# Patient Record
Sex: Female | Born: 1960 | Race: Black or African American | Hispanic: No | Smoking: Current some day smoker
Health system: Southern US, Community
[De-identification: ages and names within clinical notes are randomized; demographics above are authoritative.]

## PROBLEM LIST (undated history)

## (undated) DIAGNOSIS — F419 Anxiety disorder, unspecified: Secondary | ICD-10-CM

## (undated) DIAGNOSIS — G8929 Other chronic pain: Secondary | ICD-10-CM

## (undated) DIAGNOSIS — M549 Dorsalgia, unspecified: Secondary | ICD-10-CM

## (undated) DIAGNOSIS — I10 Essential (primary) hypertension: Secondary | ICD-10-CM

## (undated) DIAGNOSIS — F32A Depression, unspecified: Secondary | ICD-10-CM

## (undated) DIAGNOSIS — M1990 Unspecified osteoarthritis, unspecified site: Secondary | ICD-10-CM

## (undated) DIAGNOSIS — F329 Major depressive disorder, single episode, unspecified: Secondary | ICD-10-CM

## (undated) HISTORY — DX: Major depressive disorder, single episode, unspecified: F32.9

## (undated) HISTORY — DX: Essential (primary) hypertension: I10

## (undated) HISTORY — PX: OTHER SURGICAL HISTORY: SHX169

## (undated) HISTORY — DX: Depression, unspecified: F32.A

## (undated) HISTORY — DX: Unspecified osteoarthritis, unspecified site: M19.90

## (undated) HISTORY — PX: NOSE SURGERY: SHX723

## (undated) HISTORY — PX: LEG SURGERY: SHX1003

## (undated) HISTORY — DX: Anxiety disorder, unspecified: F41.9

---

## 2007-02-14 ENCOUNTER — Emergency Department: Payer: Self-pay | Admitting: Internal Medicine

## 2008-07-01 ENCOUNTER — Emergency Department: Payer: Self-pay | Admitting: Emergency Medicine

## 2009-09-30 ENCOUNTER — Emergency Department: Payer: Self-pay | Admitting: Internal Medicine

## 2010-07-20 ENCOUNTER — Emergency Department: Payer: Self-pay | Admitting: Emergency Medicine

## 2011-07-10 ENCOUNTER — Emergency Department: Payer: Self-pay | Admitting: Emergency Medicine

## 2011-07-14 ENCOUNTER — Emergency Department: Payer: Self-pay | Admitting: *Deleted

## 2012-08-28 ENCOUNTER — Emergency Department: Payer: Self-pay | Admitting: Emergency Medicine

## 2012-08-28 LAB — COMPREHENSIVE METABOLIC PANEL
BUN: 7 mg/dL (ref 7–18)
Bilirubin,Total: 0.3 mg/dL (ref 0.2–1.0)
Chloride: 109 mmol/L — ABNORMAL HIGH (ref 98–107)
Co2: 28 mmol/L (ref 21–32)
Creatinine: 0.76 mg/dL (ref 0.60–1.30)
EGFR (African American): >60
EGFR (Non-African Amer.): >60
Potassium: 3.6 mmol/L (ref 3.5–5.1)
SGOT(AST): 13 U/L — ABNORMAL LOW (ref 15–37)
SGPT (ALT): 18 U/L (ref 12–78)
Sodium: 144 mmol/L (ref 136–145)

## 2012-08-28 LAB — ACETAMINOPHEN LEVEL: Acetaminophen: <2 ug/mL

## 2012-10-22 ENCOUNTER — Ambulatory Visit: Payer: Self-pay

## 2012-11-14 ENCOUNTER — Emergency Department: Payer: Self-pay | Admitting: Emergency Medicine

## 2013-09-28 ENCOUNTER — Ambulatory Visit: Payer: Self-pay

## 2013-10-05 ENCOUNTER — Ambulatory Visit: Payer: Self-pay

## 2013-10-15 ENCOUNTER — Ambulatory Visit: Payer: Self-pay

## 2013-10-19 ENCOUNTER — Ambulatory Visit: Payer: Self-pay

## 2013-10-19 LAB — URINALYSIS, COMPLETE
Bilirubin,UR: NEGATIVE
Glucose,UR: NEGATIVE mg/dL (ref 0–75)
Ketone: NEGATIVE
Nitrite: NEGATIVE
Ph: 5 (ref 4.5–8.0)
Protein: 30
RBC,UR: 90 /HPF (ref 0–5)
Specific Gravity: 1.011 (ref 1.003–1.030)

## 2013-10-21 LAB — URINE CULTURE

## 2013-11-04 ENCOUNTER — Ambulatory Visit: Payer: Self-pay

## 2013-11-04 LAB — OCCULT BLOOD X 1 CARD TO LAB, STOOL: Occult Blood, Feces: NEGATIVE

## 2013-11-11 ENCOUNTER — Emergency Department: Payer: Self-pay | Admitting: Emergency Medicine

## 2013-11-11 LAB — BASIC METABOLIC PANEL
BUN: 12 mg/dL (ref 7–18)
Calcium, Total: 8.6 mg/dL (ref 8.5–10.1)
Chloride: 106 mmol/L (ref 98–107)
Co2: 28 mmol/L (ref 21–32)
Creatinine: 1.14 mg/dL (ref 0.60–1.30)
EGFR (Non-African Amer.): 56 — ABNORMAL LOW
Glucose: 97 mg/dL (ref 65–99)
Osmolality: 277 (ref 275–301)
Sodium: 139 mmol/L (ref 136–145)

## 2013-11-11 LAB — CBC
HCT: 40.5 % (ref 35.0–47.0)
MCHC: 33.9 g/dL (ref 32.0–36.0)
Platelet: 238 10*3/uL (ref 150–440)
RBC: 4.6 10*6/uL (ref 3.80–5.20)
WBC: 10.9 10*3/uL (ref 3.6–11.0)

## 2013-11-11 LAB — PRO B NATRIURETIC PEPTIDE: B-Type Natriuretic Peptide: 28 pg/mL (ref 0–125)

## 2013-11-14 ENCOUNTER — Ambulatory Visit: Payer: Self-pay

## 2014-01-25 ENCOUNTER — Emergency Department: Payer: Self-pay

## 2014-01-25 LAB — HEPATIC FUNCTION PANEL A (ARMC)
ALK PHOS: 77 U/L
ALT: 17 U/L (ref 12–78)
Albumin: 3.2 g/dL — ABNORMAL LOW (ref 3.4–5.0)
Bilirubin, Direct: <0.1 mg/dL (ref 0.00–0.20)
Bilirubin,Total: 0.3 mg/dL (ref 0.2–1.0)
SGOT(AST): 18 U/L (ref 15–37)
Total Protein: 7.7 g/dL (ref 6.4–8.2)

## 2014-01-25 LAB — CBC
HCT: 40.7 % (ref 35.0–47.0)
HGB: 14.1 g/dL (ref 12.0–16.0)
MCH: 30.8 pg (ref 26.0–34.0)
MCHC: 34.7 g/dL (ref 32.0–36.0)
MCV: 89 fL (ref 80–100)
Platelet: 237 10*3/uL (ref 150–440)
RBC: 4.59 10*6/uL (ref 3.80–5.20)
RDW: 12.9 % (ref 11.5–14.5)
WBC: 10.5 10*3/uL (ref 3.6–11.0)

## 2014-01-25 LAB — BASIC METABOLIC PANEL
Anion Gap: 4 — ABNORMAL LOW (ref 7–16)
BUN: 5 mg/dL — ABNORMAL LOW (ref 7–18)
CHLORIDE: 109 mmol/L — AB (ref 98–107)
Calcium, Total: 8.6 mg/dL (ref 8.5–10.1)
Co2: 27 mmol/L (ref 21–32)
Creatinine: 0.77 mg/dL (ref 0.60–1.30)
Glucose: 100 mg/dL — ABNORMAL HIGH (ref 65–99)
OSMOLALITY: 277 (ref 275–301)
POTASSIUM: 3.2 mmol/L — AB (ref 3.5–5.1)
Sodium: 140 mmol/L (ref 136–145)

## 2014-01-25 LAB — URINALYSIS, COMPLETE
BILIRUBIN, UR: NEGATIVE
Glucose,UR: NEGATIVE mg/dL (ref 0–75)
Ketone: NEGATIVE
Nitrite: NEGATIVE
Ph: 5 (ref 4.5–8.0)
Protein: NEGATIVE
Specific Gravity: 1.013 (ref 1.003–1.030)

## 2014-01-25 LAB — TROPONIN I: Troponin-I: <0.02 ng/mL

## 2014-11-02 DIAGNOSIS — K625 Hemorrhage of anus and rectum: Secondary | ICD-10-CM | POA: Insufficient documentation

## 2014-11-02 DIAGNOSIS — K921 Melena: Secondary | ICD-10-CM | POA: Insufficient documentation

## 2014-11-02 DIAGNOSIS — K30 Functional dyspepsia: Secondary | ICD-10-CM | POA: Insufficient documentation

## 2014-11-20 ENCOUNTER — Ambulatory Visit: Payer: Self-pay | Admitting: Gastroenterology

## 2014-11-29 ENCOUNTER — Emergency Department: Payer: Self-pay | Admitting: Student

## 2014-11-29 LAB — URINALYSIS, COMPLETE
BACTERIA: NONE SEEN
BILIRUBIN, UR: NEGATIVE
Glucose,UR: NEGATIVE mg/dL (ref 0–75)
Ketone: NEGATIVE
NITRITE: NEGATIVE
Ph: 5 (ref 4.5–8.0)
Protein: NEGATIVE
RBC,UR: 8 /HPF (ref 0–5)
Specific Gravity: 1.011 (ref 1.003–1.030)
Squamous Epithelial: 6
WBC UR: 4 /HPF (ref 0–5)

## 2014-11-29 LAB — COMPREHENSIVE METABOLIC PANEL
ALK PHOS: 78 U/L
ALT: 25 U/L
AST: 19 U/L (ref 15–37)
Albumin: 3.5 g/dL (ref 3.4–5.0)
Anion Gap: 5 — ABNORMAL LOW (ref 7–16)
BILIRUBIN TOTAL: 0.4 mg/dL (ref 0.2–1.0)
BUN: 11 mg/dL (ref 7–18)
CALCIUM: 9 mg/dL (ref 8.5–10.1)
CHLORIDE: 106 mmol/L (ref 98–107)
CO2: 28 mmol/L (ref 21–32)
Creatinine: 1.02 mg/dL (ref 0.60–1.30)
EGFR (Non-African Amer.): >60
GLUCOSE: 98 mg/dL (ref 65–99)
Osmolality: 277 (ref 275–301)
Potassium: 3.7 mmol/L (ref 3.5–5.1)
SODIUM: 139 mmol/L (ref 136–145)
TOTAL PROTEIN: 8.4 g/dL — AB (ref 6.4–8.2)

## 2014-11-29 LAB — CBC
HCT: 43.5 % (ref 35.0–47.0)
HGB: 14.4 g/dL (ref 12.0–16.0)
MCH: 29.9 pg (ref 26.0–34.0)
MCHC: 33.2 g/dL (ref 32.0–36.0)
MCV: 90 fL (ref 80–100)
Platelet: 262 10*3/uL (ref 150–440)
RBC: 4.83 10*6/uL (ref 3.80–5.20)
RDW: 13.8 % (ref 11.5–14.5)
WBC: 12.6 10*3/uL — ABNORMAL HIGH (ref 3.6–11.0)

## 2014-11-29 LAB — LIPASE, BLOOD: LIPASE: 129 U/L (ref 73–393)

## 2014-12-01 ENCOUNTER — Emergency Department: Payer: Self-pay | Admitting: Emergency Medicine

## 2014-12-05 DIAGNOSIS — R21 Rash and other nonspecific skin eruption: Secondary | ICD-10-CM | POA: Insufficient documentation

## 2015-01-26 ENCOUNTER — Ambulatory Visit: Payer: Self-pay | Admitting: Nurse Practitioner

## 2015-01-30 ENCOUNTER — Emergency Department: Payer: Self-pay | Admitting: Emergency Medicine

## 2015-02-15 ENCOUNTER — Ambulatory Visit: Payer: Self-pay | Admitting: Internal Medicine

## 2015-02-16 DIAGNOSIS — S83289A Other tear of lateral meniscus, current injury, unspecified knee, initial encounter: Secondary | ICD-10-CM | POA: Insufficient documentation

## 2015-02-16 DIAGNOSIS — M179 Osteoarthritis of knee, unspecified: Secondary | ICD-10-CM | POA: Insufficient documentation

## 2015-02-16 DIAGNOSIS — M1710 Unilateral primary osteoarthritis, unspecified knee: Secondary | ICD-10-CM | POA: Insufficient documentation

## 2015-03-07 ENCOUNTER — Emergency Department: Payer: Self-pay | Admitting: Emergency Medicine

## 2015-04-11 ENCOUNTER — Other Ambulatory Visit
Admit: 2015-04-11 | Disposition: A | Discharge status: Home / Self Care | Payer: Self-pay | Attending: Psychiatry | Admitting: Psychiatry

## 2015-04-11 LAB — LIPID PANEL
Cholesterol: 229 mg/dL — ABNORMAL HIGH
HDL Cholesterol: 35 mg/dL — ABNORMAL LOW
LDL CHOLESTEROL, CALC: 168 mg/dL — AB
TRIGLYCERIDES: 129 mg/dL
VLDL Cholesterol, Calc: 26 mg/dL

## 2015-04-11 LAB — GLUCOSE, RANDOM: Glucose: 108 mg/dL — ABNORMAL HIGH

## 2015-05-07 DIAGNOSIS — M797 Fibromyalgia: Secondary | ICD-10-CM | POA: Insufficient documentation

## 2015-05-07 DIAGNOSIS — M47817 Spondylosis without myelopathy or radiculopathy, lumbosacral region: Secondary | ICD-10-CM | POA: Insufficient documentation

## 2015-05-07 DIAGNOSIS — M4716 Other spondylosis with myelopathy, lumbar region: Secondary | ICD-10-CM | POA: Insufficient documentation

## 2015-05-24 ENCOUNTER — Ambulatory Visit (INDEPENDENT_AMBULATORY_CARE_PROVIDER_SITE_OTHER): Payer: Medicaid Other | Admitting: Psychiatry

## 2015-05-24 ENCOUNTER — Other Ambulatory Visit: Payer: Self-pay

## 2015-05-24 ENCOUNTER — Encounter: Payer: Self-pay | Admitting: Psychiatry

## 2015-05-24 VITALS — BP 138/88 | HR 83 | Temp 98.5°F | Ht 70.0 in

## 2015-05-24 DIAGNOSIS — F250 Schizoaffective disorder, bipolar type: Secondary | ICD-10-CM

## 2015-05-24 DIAGNOSIS — I10 Essential (primary) hypertension: Secondary | ICD-10-CM | POA: Insufficient documentation

## 2015-05-24 MED ORDER — MIRTAZAPINE 15 MG PO TABS: 15.0000 mg | ORAL_TABLET | Freq: Every day | ORAL | Status: DC

## 2015-05-24 MED ORDER — FLUOXETINE HCL 20 MG PO CAPS: 20.0000 mg | ORAL_CAPSULE | Freq: Every morning | ORAL | Status: DC

## 2015-05-24 MED ORDER — ZIPRASIDONE HCL 60 MG PO CAPS: 60.0000 mg | ORAL_CAPSULE | Freq: Two times a day (BID) | ORAL | Status: DC

## 2015-05-24 MED ORDER — CLONAZEPAM 1 MG PO TABS: 1.0000 mg | ORAL_TABLET | Freq: Two times a day (BID) | ORAL | Status: DC | PRN

## 2015-05-24 NOTE — Progress Notes (Signed)
BH MD/PA/NP OP Progress Note  05/24/2015 12:13 PM Sonia Collins  MRN:  [id number]  Subjective:  Patient presents for follow-up of her schizoaffective disorder, PTSD and anxiety. She states she feels good about current medications that she feels like her mood is not down and depressed. She states that some of her social stressors such as her kids behavior (i.e. drug use) does not bother her as much on these medications. She's also states she's done some limit setting with telling them not to come around and bring her down. She states that her appetite is been fair. We discussed the reason she stated that she used to eat her mother's cooking and because she cooks like her mother the food reminds her of her mother and it's difficult for her to eat. She states she is able to enjoy things and spends time outside with her flowers. She states is a neighbor that brings her pieces of cloth which patient sews together to make blankets. Patient states she cares for her grandchildren 3 out of 5 days of the week and enjoys this.  Stated that her sleep is okay but states that over the past few weeks she has had some difficulty sleeping because she hears her mother's voice at night. She attributes this to getting close to the anniversary of her mother's death which occurred last year in July.  As overall she feels her mood is been good and stable on the current medication regimen. She states that she has not heard the voice of [name] since we have increased her Geodon. Chief Complaint:  Chief Complaint    Other     Visit Diagnosis:     ICD-9-CM ICD-10-CM   1. Schizoaffective disorder, bipolar type 295.70 F25.0 clonazePAM (KLONOPIN) 1 MG tablet     FLUoxetine (PROZAC) 20 MG capsule     ziprasidone (GEODON) 60 MG capsule     mirtazapine (REMERON) 15 MG tablet    Past Medical History:  Past Medical History  Diagnosis Date  . Anxiety   . Depression   . Hypertension   . Arthritis     Past Surgical History   Procedure Laterality Date  . Leg surgery Bilateral    Family History:  Family History  Problem Relation Age of Onset  . Hypertension Mother   . Alcohol abuse Father   . Alcohol abuse Sister   . Alcohol abuse Brother   . Alcohol abuse Maternal Uncle   . Alcohol abuse Paternal Uncle   . Bipolar disorder Cousin   . Schizophrenia Daughter   . Depression Daughter    Social History:  History   Social History  . Marital Status: Widowed    Spouse Name: N/A  . Number of Children: N/A  . Years of Education: N/A   Social History Main Topics  . Smoking status: Current Every Day Smoker -- 0.50 packs/day for 30 years    Types: Cigarettes  . Smokeless tobacco: Never Used  . Alcohol Use: No  . Drug Use: Yes     Comment: 3 mth used marjuana  . Sexual Activity: No   Other Topics Concern  . None   Social History Narrative  . None   Additional History:   Assessment:   Musculoskeletal: Strength & Muscle Tone: within normal limits Gait & Station: normal Patient leans: N/A  Psychiatric Specialty Exam: HPI  Review of Systems  Psychiatric/Behavioral: Negative for depression, suicidal ideas, hallucinations, memory loss and substance abuse. The patient has insomnia (Patient indicated that over  the past few weeks she's had difficulty sleeping. She states she might wake up at night because she hears her mother's voice. She states she is approaching the anniversary of his mother's death which occurred in 2023-07-04 of last yea). The patient is not nervous/anxious.     Blood pressure 138/88, pulse 83, temperature 98.5 F (36.9 C), temperature source Tympanic, height  (1.778 m), SpO2 94 %.There is no weight on file to calculate BMI.  General Appearance: Well Groomed  Eye Contact:  Good  Speech:  Normal Rate  Volume:  Normal  Mood:  Good  Affect:  Congruent  Thought Process:  Goal Directed and Linear  Orientation:  Full (Time, Place, and Person)  Thought Content:  Negative  Suicidal  Thoughts:  No  Homicidal Thoughts:  No  Memory:  Immediate;   Good Recent;   Good Remote;   Good  Judgement:  Fair  Insight:  Fair  Psychomotor Activity:  Negative  Concentration:  Good  Recall:  Good  Fund of Knowledge: Good  Language: Good  Akathisia:  Negative  Handed:  Right unknown  AIMS (if indicated):  Done in March 2016 in [employer]  Assets:  Desire for Improvement  ADL's:  Intact  Cognition: WNL  Sleep:  Fair as noted above   Is the patient at risk to self?  No. Has the patient been a risk to self in the past 6 months?  No. Has the patient been a risk to self within the distant past?  Yes.   Is the patient a risk to others?  No. Has the patient been a risk to others in the past 6 months?  No. Has the patient been a risk to others within the distant past?  Yes.    Current Medications: Current Outpatient Prescriptions  Medication Sig Dispense Refill  . clonazePAM (KLONOPIN) 1 MG tablet Take 1 tablet (1 mg total) by mouth 2 (two) times daily as needed for anxiety. 60 tablet 1  . FLUoxetine (PROZAC) 20 MG capsule Take 1 capsule (20 mg total) by mouth every morning. 30 capsule 2  . guaiFENesin-codeine (GUAIFENESIN AC) 100-10 MG/5ML syrup     . simvastatin (ZOCOR) 10 MG tablet   0  . ziprasidone (GEODON) 60 MG capsule Take 1 capsule (60 mg total) by mouth 2 (two) times daily. 60 capsule 2  . chlorhexidine (PERIDEX) 0.12 % solution SWISH WITH 1/2 OUNCE FOR 30 SECONDS TWICE DAILY OR USE AS DIRECTED.  0  . clindamycin (CLEOCIN) 300 MG capsule TAKE 3 TIMES A DAY UNTIL GONE  0  . HYDROcodone-acetaminophen (NORCO/VICODIN) 5-325 MG per tablet TAKE 1 TO 2 TABLETS EVERY 4 TO 6 HOURS AS NEEDED FOR PAIN.  0  . hydrOXYzine (VISTARIL) 50 MG capsule take 1 capsule by mouth twice a day if needed  0  . lisinopril-hydrochlorothiazide (PRINZIDE,ZESTORETIC) 10-12.5 MG per tablet   0  . mirtazapine (REMERON) 15 MG tablet Take 1 tablet (15 mg total) by mouth at bedtime. 30 tablet 2  .  tetracycline (ACHROMYCIN,SUMYCIN) 250 MG capsule TAKE 1 CAPSULE 3 TIMES DAILY.  0  . traMADol (ULTRAM) 50 MG tablet TAKE 1 TO 2 TABLETS EVERY 4 TO SIX HOURS, DO NOT EXCEED 8 TABLETS IN 24 HOURS  0  . ziprasidone (GEODON) 20 MG capsule Take 20 mg by mouth 2 (two) times daily.  0  . ziprasidone (GEODON) 40 MG capsule Take 40 mg by mouth 2 (two) times daily.  0   No current facility-administered medications  for this visit.    Medical Decision Making:  Established Problem, Stable/Improving (1)  Treatment Plan Summary:Medication management Patient is currently stable continue her medications without any changes. She'll continue Geodon 60 mg twice daily, Prozac 20 mg daily and Remeron 15 mg at bedtime. She'll also continue her clonazepam 1 mg twice daily as needed for anxiety. She'll follow up in 2 months. She'll encourage call if questions or concerns prior to her next appointment.  Wallace Going 05/24/2015, 12:13 PM

## 2015-05-30 ENCOUNTER — Ambulatory Visit: Payer: Medicaid Other | Admitting: Licensed Clinical Social Worker

## 2015-06-15 DIAGNOSIS — M5136 Other intervertebral disc degeneration, lumbar region: Secondary | ICD-10-CM | POA: Insufficient documentation

## 2015-06-15 DIAGNOSIS — M5116 Intervertebral disc disorders with radiculopathy, lumbar region: Secondary | ICD-10-CM | POA: Insufficient documentation

## 2015-06-27 ENCOUNTER — Ambulatory Visit (INDEPENDENT_AMBULATORY_CARE_PROVIDER_SITE_OTHER): Payer: Medicaid Other | Admitting: Psychiatry

## 2015-06-27 ENCOUNTER — Encounter: Payer: Self-pay | Admitting: Psychiatry

## 2015-06-27 VITALS — BP 143/97 | HR 83 | Resp 12

## 2015-06-27 DIAGNOSIS — F250 Schizoaffective disorder, bipolar type: Secondary | ICD-10-CM

## 2015-06-27 MED ORDER — CLONAZEPAM 1 MG PO TABS: 1.0000 mg | ORAL_TABLET | Freq: Two times a day (BID) | ORAL | Status: DC | PRN

## 2015-06-27 MED ORDER — ZIPRASIDONE HCL 80 MG PO CAPS: 80.0000 mg | ORAL_CAPSULE | Freq: Two times a day (BID) | ORAL | Status: DC
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
## 2015-07-04 ENCOUNTER — Other Ambulatory Visit: Payer: Self-pay

## 2015-07-04 ENCOUNTER — Ambulatory Visit (INDEPENDENT_AMBULATORY_CARE_PROVIDER_SITE_OTHER): Payer: Medicaid Other | Admitting: Licensed Clinical Social Worker

## 2015-07-04 DIAGNOSIS — F250 Schizoaffective disorder, bipolar type: Secondary | ICD-10-CM
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
## 2015-07-05 DIAGNOSIS — F250 Schizoaffective disorder, bipolar type: Secondary | ICD-10-CM | POA: Insufficient documentation

## 2015-07-13 ENCOUNTER — Other Ambulatory Visit: Payer: Self-pay | Admitting: Nurse Practitioner

## 2015-07-13 DIAGNOSIS — Z1231 Encounter for screening mammogram for malignant neoplasm of breast: Secondary | ICD-10-CM

## 2015-07-18 ENCOUNTER — Ambulatory Visit: Payer: Self-pay | Admitting: Licensed Clinical Social Worker

## 2015-07-25 ENCOUNTER — Ambulatory Visit (INDEPENDENT_AMBULATORY_CARE_PROVIDER_SITE_OTHER): Payer: Medicaid Other | Admitting: Psychiatry

## 2015-07-25 ENCOUNTER — Encounter: Payer: Self-pay | Admitting: Psychiatry

## 2015-07-25 VITALS — BP 122/78 | HR 90 | Temp 98.4°F | Ht 70.0 in | Wt 267.0 lb

## 2015-07-25 DIAGNOSIS — F250 Schizoaffective disorder, bipolar type: Secondary | ICD-10-CM | POA: Diagnosis not present

## 2015-07-25 NOTE — Progress Notes (Signed)
BH MD/PA/NP OP Progress Note  07/25/2015 11:01 AM Sonia Collins  MRN:  [id number]  Subjective:  Patient returns for follow-up of her PTSD and Schizoaffective Disorder. She is able to state that the medications are helping her stay calm. She states she has not heard bob's voice. However she does state she has been moving around her house talking to herself and states she largely does this because of the stress of the home environment. She discusses similar things that she has in the past appointments such as her middle daughter causing her stress. She states they say hateful things to her. She states this is upsetting. She states she feels like these things make her want to abuse drugs. She states she has not returned to drinking but she does state she has used some marijuana and states it's probably about once per week. Spent some time thinking about options that she has finding alternative settings to live in. She states she is limited financially. She indicated she is currently assessed as being partially disabled. I told her that she could call the office and perhaps we could assess her current status in regards to disability. Chief Complaint:  Chief Complaint    Anxiety; Family Problem; Depression; Medication Refill; Follow-up; Panic Attack     Visit Diagnosis:  No diagnosis found.  Past Medical History:  Past Medical History  Diagnosis Date  . Anxiety   . Depression   . Hypertension   . Arthritis     Past Surgical History  Procedure Laterality Date  . Leg surgery Bilateral    Family History:  Family History  Problem Relation Age of Onset  . Hypertension Mother   . Alcohol abuse Father   . Alcohol abuse Sister   . Drug abuse Sister   . Alcohol abuse Brother   . Drug abuse Brother   . Alcohol abuse Maternal Uncle   . Alcohol abuse Paternal Uncle   . Bipolar disorder Cousin   . Schizophrenia Daughter   . Depression Daughter   . Alcohol abuse Sister   . Alcohol abuse Sister    . Alcohol abuse Sister   . Alcohol abuse Sister   . Alcohol abuse Sister   . Alcohol abuse Sister   . Alcohol abuse Brother   . Alcohol abuse Brother   . Alcohol abuse Brother   . Alcohol abuse Brother   . Alcohol abuse Brother   . Alcohol abuse Brother   . Alcohol abuse Brother    Social History:  Social History   Social History  . Marital Status: Widowed    Spouse Name: N/A  . Number of Children: N/A  . Years of Education: N/A   Social History Main Topics  . Smoking status: Current Some Day Smoker -- 0.50 packs/day for 30 years    Types: Cigarettes    Start date: 07/24/2005  . Smokeless tobacco: Never Used  . Alcohol Use: No  . Drug Use: Yes     Comment: 3 mth used marjuana  . Sexual Activity: No   Other Topics Concern  . None   Social History Narrative   Additional History:  Assessment:   Musculoskeletal: Strength & Muscle Tone: within normal limits Gait & Station: normal Patient leans: N/A  Psychiatric Specialty Exam: HPI  Review of Systems  Psychiatric/Behavioral: Negative for depression, suicidal ideas, hallucinations, memory loss and substance abuse. The patient is nervous/anxious. The patient does not have insomnia.     Blood pressure 122/78, pulse 90, temperature 98.4  F (36.9 C), temperature source Tympanic, height 5\' 10"  (1.778 m), weight 267 lb (121.11 kg), SpO2 92 %.Body mass index is 38.31 kg/(m^2).  General Appearance: Fairly Groomed  Eye Contact:  Good  Speech:  Normal Rate  Volume:  Normal  Mood:  Okay  Affect:  Constricted tearful when discussing the relationship with her family members   Thought Process:  Linear and Logical  Orientation:  Full (Time, Place, and Person)  Thought Content:  Negative  Suicidal Thoughts:  No  Homicidal Thoughts:  No  Memory:  Immediate;   Good Recent;   Good Remote;   Good  Judgement:  Fair  Insight:  Fair  Psychomotor Activity:  Negative  Concentration:  Good  Recall:  Good  Fund of Knowledge:  Fair  Language: Fair  Akathisia:  Yes  Handed:  Right unknown   AIMS (if indicated): not DOEN TODAY  Assets:  Desire for Improvement  ADL's:  Intact  Cognition: WNL  Sleep:  GOOD   Is the patient at risk to self?  No. Has the patient been a risk to self in the past 6 months?  No. Has the patient been a risk to self within the distant past?  No. Is the patient a risk to others?  No. Has the patient been a risk to others in the past 6 months?  No. Has the patient been a risk to others within the distant past?  No.  Current Medications: Current Outpatient Prescriptions  Medication Sig Dispense Refill  . chlorhexidine (PERIDEX) 0.12 % solution SWISH WITH 1/2 OUNCE FOR 30 SECONDS TWICE DAILY OR USE AS DIRECTED.  0  . clindamycin (CLEOCIN) 300 MG capsule TAKE 3 TIMES A DAY UNTIL GONE  0  . clonazePAM (KLONOPIN) 1 MG tablet Take 1 tablet (1 mg total) by mouth 2 (two) times daily as needed for anxiety. 60 tablet 1  . FLUoxetine (PROZAC) 20 MG capsule Take 1 capsule (20 mg total) by mouth every morning. 30 capsule 2  . hydrOXYzine (VISTARIL) 50 MG capsule take 1 capsule by mouth twice a day if needed  0  . lisinopril-hydrochlorothiazide (PRINZIDE,ZESTORETIC) 10-12.5 MG per tablet   0  . mirtazapine (REMERON) 15 MG tablet Take 1 tablet (15 mg total) by mouth at bedtime. 30 tablet 2  . naproxen (NAPROSYN) 500 MG tablet Take 500 mg by mouth 2 (two) times daily with a meal.  0  . pravastatin (PRAVACHOL) 40 MG tablet Take 40 mg by mouth every evening. for cholesterol  0  . simvastatin (ZOCOR) 10 MG tablet   0  . tetracycline (ACHROMYCIN,SUMYCIN) 250 MG capsule TAKE 1 CAPSULE 3 TIMES DAILY.  0  . ziprasidone (GEODON) 80 MG capsule Take 1 capsule (80 mg total) by mouth 2 (two) times daily with a meal. 60 capsule 1   No current facility-administered medications for this visit.    Medical Decision Making:  Established Problem, Stable/Improving (1) and Review of Medication Regimen & Side Effects  (2)  Treatment Plan Summary:Medication management and Plan Patient has been stable on her current regimen. She denies hearing auditory hallucinations of [name] which has been a previous issue. She states the medications do he keep her calm. The biggest problem appears to be some issues with her social supports and living situation. We will continue the patient on her medications without any changes. She will continue the Geodon 80 mg twice daily, Remeron 15 mg at bedtime, Prozac 20 mg daily and clonazepam 1 mg twice daily. She  will continue with her therapy. She will follow up in 1 month. She should have enough of her medications to carry her until her next appointment in 1 month.   Wallace Going 07/25/2015, 11:01 AM

## 2015-07-26 ENCOUNTER — Ambulatory Visit
Admission: RE | Admit: 2015-07-26 | Discharge: 2015-07-26 | Disposition: A | Discharge status: Home / Self Care | Payer: Medicaid Other | Source: Ambulatory Visit | Attending: Nurse Practitioner | Admitting: Nurse Practitioner

## 2015-07-26 ENCOUNTER — Ambulatory Visit: Payer: Self-pay

## 2015-07-26 ENCOUNTER — Encounter (INDEPENDENT_AMBULATORY_CARE_PROVIDER_SITE_OTHER): Payer: Self-pay

## 2015-07-26 DIAGNOSIS — Z1231 Encounter for screening mammogram for malignant neoplasm of breast: Secondary | ICD-10-CM | POA: Diagnosis not present

## 2015-07-27 ENCOUNTER — Telehealth: Payer: Self-pay

## 2015-07-27 NOTE — Telephone Encounter (Signed)
pt called stated that you needed the phone # to [employer]  [phone]  pt states you will need her ss# [id number] and her DOB [birth year].

## 2015-07-27 NOTE — Telephone Encounter (Signed)
I contacted the office of Social Security to provide an update as to the patient's mental health. However I contacted the number the patient left they were not able to give me any information as they had an authorization to disclose information to me. I've contacted the patient and informed her of this. The Social Security office informing the patient will need to call and they can direct her as to how to involve me in providing any updates. AW

## 2015-08-02 ENCOUNTER — Ambulatory Visit: Payer: Self-pay | Admitting: Psychiatry

## 2015-08-28 ENCOUNTER — Ambulatory Visit: Payer: Medicaid Other | Admitting: Psychiatry

## 2015-08-29 ENCOUNTER — Encounter: Payer: Self-pay | Admitting: Psychiatry

## 2015-08-29 ENCOUNTER — Ambulatory Visit (INDEPENDENT_AMBULATORY_CARE_PROVIDER_SITE_OTHER): Payer: Medicaid Other | Admitting: Psychiatry

## 2015-08-29 VITALS — BP 124/82 | HR 98 | Temp 98.2°F | Ht 70.0 in | Wt 267.4 lb

## 2015-08-29 DIAGNOSIS — F250 Schizoaffective disorder, bipolar type: Secondary | ICD-10-CM

## 2015-08-29 MED ORDER — ZIPRASIDONE HCL 80 MG PO CAPS: 80.0000 mg | ORAL_CAPSULE | Freq: Two times a day (BID) | ORAL | Status: DC

## 2015-08-29 MED ORDER — FLUOXETINE HCL 20 MG PO CAPS: 20.0000 mg | ORAL_CAPSULE | Freq: Every morning | ORAL | Status: DC

## 2015-08-29 MED ORDER — TRAZODONE HCL 50 MG PO TABS: ORAL_TABLET | ORAL | Status: DC

## 2015-08-29 MED ORDER — MIRTAZAPINE 15 MG PO TABS: 15.0000 mg | ORAL_TABLET | Freq: Every day | ORAL | Status: DC

## 2015-08-29 MED ORDER — CLONAZEPAM 1 MG PO TABS: 1.0000 mg | ORAL_TABLET | Freq: Two times a day (BID) | ORAL | Status: DC | PRN

## 2015-08-29 NOTE — Progress Notes (Signed)
BH MD/PA/NP OP Progress Note  08/29/2015 3:34 PM Sonia Collins  MRN:  [id number]  Subjective:  Patient returns for follow-up of her PTSD and Schizoaffective Disorder. This appointment she speaks positively about her interactions with one of her daughters. She discusses avoiding another daughter who she reports is hateful things and causes her stress. She states that she does look forward to on a breakfast with the one daughter and her granddaughter. She states they do this several times a week. She stated that her medications generally of been helping her mood is generally pretty good. She states right now the biggest issue is sleep. She discussed that she has sleep onset experiences such as seeing her mother. She states she occasionally has dreams that we'll involve her mother but they are not nightmares. That the medication she takes might help her sleep for a few hours but then she is up and then she is having these feelings and thoughts and at times sometimes smells that reminded her of other people. Chief Complaint: Dreaming Chief Complaint    Follow-up; Medication Refill; Other; Memory Loss; Medication Problem     Visit Diagnosis:     ICD-9-CM ICD-10-CM   1. Schizoaffective disorder, bipolar type 295.70 F25.0 clonazePAM (KLONOPIN) 1 MG tablet     FLUoxetine (PROZAC) 20 MG capsule     ziprasidone (GEODON) 80 MG capsule     mirtazapine (REMERON) 15 MG tablet    Past Medical History:  Past Medical History  Diagnosis Date  . Anxiety   . Depression   . Hypertension   . Arthritis     Past Surgical History  Procedure Laterality Date  . Leg surgery Bilateral    Family History:  Family History  Problem Relation Age of Onset  . Hypertension Mother   . Alcohol abuse Father   . Alcohol abuse Sister   . Drug abuse Sister   . Alcohol abuse Brother   . Drug abuse Brother   . Alcohol abuse Maternal Uncle   . Alcohol abuse Paternal Uncle   . Bipolar disorder Cousin   . Schizophrenia  Daughter   . Depression Daughter   . Alcohol abuse Sister   . Alcohol abuse Sister   . Alcohol abuse Sister   . Alcohol abuse Sister   . Alcohol abuse Sister   . Alcohol abuse Sister   . Alcohol abuse Brother   . Alcohol abuse Brother   . Alcohol abuse Brother   . Alcohol abuse Brother   . Alcohol abuse Brother   . Alcohol abuse Brother   . Alcohol abuse Brother    Social History:  Social History   Social History  . Marital Status: Widowed    Spouse Name: N/A  . Number of Children: N/A  . Years of Education: N/A   Social History Main Topics  . Smoking status: Current Some Day Smoker -- 0.50 packs/day for 30 years    Types: Cigarettes    Start date: 07/24/2005  . Smokeless tobacco: Never Used  . Alcohol Use: No  . Drug Use: Yes     Comment: 3 mth used marjuana  . Sexual Activity: No   Other Topics Concern  . None   Social History Narrative   Additional History:  Assessment:   Musculoskeletal: Strength & Muscle Tone: within normal limits Gait & Station: normal Patient leans: N/A  Psychiatric Specialty Exam: HPI  Review of Systems  Psychiatric/Behavioral: Negative for depression, suicidal ideas, hallucinations, memory loss and substance abuse. The patient  has insomnia. The patient is not nervous/anxious.     Blood pressure 124/82, pulse 98, temperature 98.2 F (36.8 C), temperature source Tympanic, height 5\' 10"  (1.778 m), weight 267 lb 6.4 oz (121.292 kg), SpO2 94 %.Body mass index is 38.37 kg/(m^2).  General Appearance: Fairly Groomed  Eye Contact:  Good  Speech:  Normal Rate  Volume:  Normal  Mood:  Good  Affect:  Patient was brighter than in past appointments and was times able to smile he became tearful when discussing not wanting to go back to her brothers home for a reunion as it would remind her of her late mother, but otherwise was bright during the appointment   Thought Process:  Linear and Logical  Orientation:  Full (Time, Place, and Person)   Thought Content:  Negative  Suicidal Thoughts:  No  Homicidal Thoughts:  No  Memory:  Immediate;   Good Recent;   Good Remote;   Good  Judgement:  Fair  Insight:  Fair  Psychomotor Activity:  Negative  Concentration:  Good  Recall:  Good  Fund of Knowledge: Fair  Language: Fair  Akathisia:  Yes  Handed:  Right unknown   AIMS (if indicated): not DOEN TODAY  Assets:  Desire for Improvement  ADL's:  Intact  Cognition: WNL  Sleep:  GOOD   Is the patient at risk to self?  No. Has the patient been a risk to self in the past 6 months?  No. Has the patient been a risk to self within the distant past?  No. Is the patient a risk to others?  No. Has the patient been a risk to others in the past 6 months?  No. Has the patient been a risk to others within the distant past?  No.  Current Medications: Current Outpatient Prescriptions  Medication Sig Dispense Refill  . ADVAIR DISKUS 250-50 MCG/DOSE AEPB inhale 1 dose by mouth twice a day FOR ASTHMA/COPD CONTROL  0  . chlorhexidine (PERIDEX) 0.12 % solution SWISH WITH 1/2 OUNCE FOR 30 SECONDS TWICE DAILY OR USE AS DIRECTED.  0  . clindamycin (CLEOCIN) 300 MG capsule TAKE 3 TIMES A DAY UNTIL GONE  0  . clonazePAM (KLONOPIN) 1 MG tablet Take 1 tablet (1 mg total) by mouth 2 (two) times daily as needed for anxiety. 60 tablet 2  . FLUoxetine (PROZAC) 20 MG capsule Take 1 capsule (20 mg total) by mouth every morning. 30 capsule 2  . lisinopril-hydrochlorothiazide (PRINZIDE,ZESTORETIC) 10-12.5 MG per tablet   0  . lisinopril-hydrochlorothiazide (PRINZIDE,ZESTORETIC) 20-25 MG per tablet Take 1 tablet by mouth daily. for high blood pressure  0  . mirtazapine (REMERON) 15 MG tablet Take 1 tablet (15 mg total) by mouth at bedtime. 30 tablet 2  . naproxen (NAPROSYN) 500 MG tablet Take 500 mg by mouth 2 (two) times daily with a meal.  0  . pravastatin (PRAVACHOL) 40 MG tablet Take 40 mg by mouth every evening. for cholesterol  0  . simvastatin (ZOCOR)  10 MG tablet   0  . tetracycline (ACHROMYCIN,SUMYCIN) 250 MG capsule TAKE 1 CAPSULE 3 TIMES DAILY.  0  . ziprasidone (GEODON) 80 MG capsule Take 1 capsule (80 mg total) by mouth 2 (two) times daily with a meal. 60 capsule 2  . traZODone (DESYREL) 50 MG tablet Take one to two tablets at bedtime as needed for sleep. 60 tablet 1   No current facility-administered medications for this visit.    Medical Decision Making:  Established Problem, Stable/Improving (  1) and Review of Medication Regimen & Side Effects (2)  Treatment Plan Summary:Medication management and Plan Patient has been stable on her current regimen. She denies hearing auditory hallucinations of [name] which has been a previous issue. She states the medications do he keep her calm. The biggest problem appears to be some issues with her social supports and living situation. We will continue the patient on her medications without any changes. She will continue the Geodon 80 mg twice daily, Remeron 15 mg at bedtime, Prozac 20 mg daily and clonazepam 1 mg twice daily. We will add trazodone 50-100 mg at bedtime as needed for insomnia area risk and benefits of been discussing patient's able to consent She will continue with her therapy. She will follow up in 1 month. She should have enough of her medications to carry her until her next appointment in 2 months.  PTSD-continue Remeron and Prozac and lorazepam Schizoaffective Disorder  disorder-continue Geodon 80 mg twice daily.  Wallace Going 08/29/2015, 3:34 PM

## 2015-09-03 ENCOUNTER — Telehealth: Payer: Self-pay | Admitting: Psychiatry

## 2015-09-03 NOTE — Telephone Encounter (Addendum)
Patient wanted to speak with this writer because she states that physician gave her the wrong medication being some ibuprofen. She states that within the same clinic she is been assigned to see another doctor. She stated that she wanted to speak me because she was getting upset. I told her to try the new doctor and that may be a positive experience. She indicated she felt she could remain calm and try the new appointment with the new doctor within the [facility]. AW

## 2015-09-05 ENCOUNTER — Emergency Department
Admission: EM | Admit: 2015-09-05 | Discharge: 2015-09-05 | Disposition: A | Discharge status: Home / Self Care | Payer: Medicaid Other | Attending: Emergency Medicine | Admitting: Emergency Medicine

## 2015-09-05 ENCOUNTER — Emergency Department: Payer: Medicaid Other

## 2015-09-05 ENCOUNTER — Encounter: Payer: Self-pay | Admitting: Emergency Medicine

## 2015-09-05 DIAGNOSIS — R0602 Shortness of breath: Secondary | ICD-10-CM | POA: Diagnosis not present

## 2015-09-05 DIAGNOSIS — Z720 Tobacco use: Secondary | ICD-10-CM | POA: Insufficient documentation

## 2015-09-05 DIAGNOSIS — Z79899 Other long term (current) drug therapy: Secondary | ICD-10-CM | POA: Insufficient documentation

## 2015-09-05 DIAGNOSIS — Z91040 Latex allergy status: Secondary | ICD-10-CM | POA: Insufficient documentation

## 2015-09-05 DIAGNOSIS — Z880 Allergy status to penicillin: Secondary | ICD-10-CM | POA: Diagnosis not present

## 2015-09-05 DIAGNOSIS — R079 Chest pain, unspecified: Secondary | ICD-10-CM | POA: Insufficient documentation

## 2015-09-05 DIAGNOSIS — I10 Essential (primary) hypertension: Secondary | ICD-10-CM | POA: Diagnosis not present

## 2015-09-05 DIAGNOSIS — R55 Syncope and collapse: Secondary | ICD-10-CM | POA: Diagnosis present

## 2015-09-05 DIAGNOSIS — Z791 Long term (current) use of non-steroidal anti-inflammatories (NSAID): Secondary | ICD-10-CM | POA: Diagnosis not present

## 2015-09-05 HISTORY — DX: Other chronic pain: G89.29

## 2015-09-05 HISTORY — DX: Dorsalgia, unspecified: M54.9

## 2015-09-05 LAB — URINALYSIS COMPLETE WITH MICROSCOPIC (ARMC ONLY)
Bilirubin Urine: NEGATIVE
GLUCOSE, UA: NEGATIVE mg/dL
Ketones, ur: NEGATIVE mg/dL
NITRITE: NEGATIVE
PH: 5 (ref 5.0–8.0)
PROTEIN: NEGATIVE mg/dL
SPECIFIC GRAVITY, URINE: 1.013 (ref 1.005–1.030)

## 2015-09-05 LAB — CBC
HEMATOCRIT: 40.6 % (ref 35.0–47.0)
HEMOGLOBIN: 13.5 g/dL (ref 12.0–16.0)
MCH: 29.5 pg (ref 26.0–34.0)
MCHC: 33.2 g/dL (ref 32.0–36.0)
MCV: 88.8 fL (ref 80.0–100.0)
Platelets: 219 10*3/uL (ref 150–440)
RBC: 4.57 MIL/uL (ref 3.80–5.20)
RDW: 13.3 % (ref 11.5–14.5)
WBC: 11.5 10*3/uL — AB (ref 3.6–11.0)

## 2015-09-05 LAB — BASIC METABOLIC PANEL
ANION GAP: 7 (ref 5–15)
BUN: 7 mg/dL (ref 6–20)
CHLORIDE: 105 mmol/L (ref 101–111)
CO2: 28 mmol/L (ref 22–32)
Calcium: 8.8 mg/dL — ABNORMAL LOW (ref 8.9–10.3)
Creatinine, Ser: 0.69 mg/dL (ref 0.44–1.00)
GFR calc Af Amer: >60 mL/min (ref >60)
Glucose, Bld: 127 mg/dL — ABNORMAL HIGH (ref 65–99)
POTASSIUM: 3 mmol/L — AB (ref 3.5–5.1)
SODIUM: 140 mmol/L (ref 135–145)

## 2015-09-05 LAB — TROPONIN I

## 2015-09-05 LAB — MAGNESIUM: Magnesium: 2.1 mg/dL (ref 1.7–2.4)

## 2015-09-05 MED ORDER — MAGNESIUM OXIDE 400 (241.3 MG) MG PO TABS
ORAL_TABLET | ORAL | Status: AC
  Administered 2015-09-05: 400 mg via ORAL
  Filled 2015-09-05: qty 1

## 2015-09-05 MED ORDER — MAGNESIUM OXIDE 400 (241.3 MG) MG PO TABS
400.0000 mg | ORAL_TABLET | Freq: Once | ORAL | Status: AC
  Administered 2015-09-05: 400 mg via ORAL

## 2015-09-05 MED ORDER — ACETAMINOPHEN 325 MG PO TABS
650.0000 mg | ORAL_TABLET | Freq: Once | ORAL | Status: AC
  Administered 2015-09-05: 650 mg via ORAL

## 2015-09-05 MED ORDER — ACETAMINOPHEN 325 MG PO TABS
ORAL_TABLET | ORAL | Status: AC
  Administered 2015-09-05: 650 mg via ORAL
  Filled 2015-09-05: qty 2

## 2015-09-05 MED ORDER — POTASSIUM CHLORIDE CRYS ER 20 MEQ PO TBCR
EXTENDED_RELEASE_TABLET | ORAL | Status: AC
  Administered 2015-09-05: 40 meq via ORAL
  Filled 2015-09-05: qty 2

## 2015-09-05 MED ORDER — POTASSIUM CHLORIDE ER 10 MEQ PO TBCR: 20.0000 meq | EXTENDED_RELEASE_TABLET | Freq: Every day | ORAL | Status: DC

## 2015-09-05 MED ORDER — POTASSIUM CHLORIDE CRYS ER 20 MEQ PO TBCR
40.0000 meq | EXTENDED_RELEASE_TABLET | Freq: Once | ORAL | Status: AC
  Administered 2015-09-05: 40 meq via ORAL

## 2015-09-05 NOTE — Discharge Instructions (Signed)
Chest Pain (Nonspecific) It is often hard to give a diagnosis for the cause of chest pain. There is always a chance that your pain could be related to something serious, such as a heart attack or a blood clot in the lungs. You need to follow up with your doctor. HOME CARE  If antibiotic medicine was given, take it as directed by your doctor. Finish the medicine even if you start to feel better.  For the next few days, avoid activities that bring on chest pain. Continue physical activities as told by your doctor.  Do not use any tobacco products. This includes cigarettes, chewing tobacco, and e-cigarettes.  Avoid drinking alcohol.  Only take medicine as told by your doctor.  Follow your doctor's suggestions for more testing if your chest pain does not go away.  Keep all doctor visits you made. GET HELP IF:  Your chest pain does not go away, even after treatment.  You have a rash with blisters on your chest.  You have a fever. GET HELP RIGHT AWAY IF:   You have more pain or pain that spreads to your arm, neck, jaw, back, or belly (abdomen).  You have shortness of breath.  You cough more than usual or cough up blood.  You have very bad back or belly pain.  You feel sick to your stomach (nauseous) or throw up (vomit).  You have very bad weakness.  You pass out (faint).  You have chills. This is an emergency. Do not wait to see if the problems will go away. Call your local emergency services (911 in [place] Do not drive yourself to the hospital. MAKE SURE YOU:   Understand these instructions.  Will watch your condition.  Will get help right away if you are not doing well or get worse. Document Released: 05/19/2008 Document Revised: 12/06/2013 Document Reviewed: 05/19/2008 [facility] Patient Information 2015 [place], [place]. This information is not intended to replace advice given to you by your health care provider. Make sure you discuss any questions you have with your  health care provider.  Syncope Syncope means a person passes out (faints). The person usually wakes up in less than 5 minutes. It is important to seek medical care for syncope. HOME CARE  Have someone stay with you until you feel normal.  Do not drive, use machines, or play sports until your doctor says it is okay.  Keep all doctor visits as told.  Lie down when you feel like you might pass out. Take deep breaths. Wait until you feel normal before standing up.  Drink enough fluids to keep your pee (urine) clear or pale yellow.  If you take blood pressure or heart medicine, get up slowly. Take several minutes to sit and then stand. GET HELP RIGHT AWAY IF:   You have a severe headache.  You have pain in the chest, belly (abdomen), or back.  You are bleeding from the mouth or butt (rectum).  You have black or tarry poop (stool).  You have an irregular or very fast heartbeat.  You have pain with breathing.  You keep passing out, or you have shaking (seizures) when you pass out.  You pass out when sitting or lying down.  You feel confused.  You have trouble walking.  You have severe weakness.  You have vision problems. If you fainted, call your local emergency services (911 in [place] Do not drive yourself to the hospital. MAKE SURE YOU:   Understand these instructions.  Will watch  your condition.  Will get help right away if you are not doing well or get worse. Document Released: 05/19/2008 Document Revised: 06/01/2012 Document Reviewed: 01/30/2012 [facility] Patient Information 2015 [place], [place]. This information is not intended to replace advice given to you by your health care provider. Make sure you discuss any questions you have with your health care provider.

## 2015-09-05 NOTE — ED Notes (Signed)
Patient reports having syncopal episode last night, hitting head. Believes she hit head on computer table. Does not remember any of the moments before or after syncope. Reports feeling dizzy today. +HA. Denies taking blood thinners.

## 2015-09-05 NOTE — ED Provider Notes (Signed)
[facility] Emergency Department Provider Note  ____________________________________________  Time seen: Approximately 215 PM  I have reviewed the triage vital signs and the nursing notes.   HISTORY  Chief Complaint Loss of Consciousness    HPI Sonia Collins is a 54 y.o. female with a history of anxiety and hypertension who is presenting today with a possible syncopal episode. The patient says that she thinks she woke in the night and passed out and hit her head on a computer table beside her bed. She said that she went to sleep and woke up in her bed. However, she said that she had not explain bump on the left side of her forehead this morning. She denies remembering all of she had passed out or not. However, she suspects this is the way that she injured herself.  She denies any chest pain or shortness of breath at this time. However, she says that she has had chronic and ongoing chest pain. She had an episode of chest pain this morning for about an hour to the right side of her chest. She says this is typical of her chest pain. It was sharp and nonradiating. She said that she has had emergency department visits for this in the past and has been cleared. She says that she also had an episode of dizziness this morning upon rising from her bed quickly. However, she does not note any dizziness at this time was able to walk to the room without any assistance. She says that she has had multiple episodes of dizziness in the past and it is not associated with any chest pain or palpitations. She was sent into the emergency department this morning by her primary care physician after telling her primary care physician this story. The primary care appointment was for routine checkup.   Past Medical History  Diagnosis Date  . Anxiety   . Depression   . Hypertension   . Arthritis   . Chronic back pain     Patient Active Problem List   Diagnosis Date Noted  . Schizoaffective  disorder, bipolar type 07/05/2015  . DDD (degenerative disc disease), lumbar 06/15/2015  . Neuritis or radiculitis due to rupture of lumbar intervertebral disc 06/15/2015  . BP (high blood pressure) 05/24/2015  . Fibrositis 05/07/2015  . 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 Fibromyalgia 05/07/2015  . Lumbar and sacral osteoarthritis 05/07/2015  . Current tear knee, lateral meniscus 02/16/2015  . Arthritis of knee, degenerative 02/16/2015  . Cutaneous eruption 12/05/2014  . Bleeding per rectum 11/02/2014  . Acid indigestion 11/02/2014    Past Surgical History  Procedure Laterality Date  . Leg surgery Bilateral     Current Outpatient Rx  Name  Route  Sig  Dispense  Refill  . ADVAIR DISKUS 250-50 MCG/DOSE AEPB      inhale 1 dose by mouth twice a day FOR ASTHMA/COPD CONTROL      0     Dispense as written.   . chlorhexidine (PERIDEX) 0.12 % solution      SWISH WITH 1/2 OUNCE FOR 30 SECONDS TWICE DAILY OR USE AS DIRECTED.      0   . clindamycin (CLEOCIN) 300 MG capsule      TAKE 3 TIMES A DAY UNTIL GONE      0   . clonazePAM (KLONOPIN) 1 MG tablet   Oral   Take 1 tablet (1 mg total) by mouth 2 (two) times daily as needed for  anxiety.   60 tablet   2   . FLUoxetine (PROZAC) 20 MG capsule   Oral   Take 1 capsule (20 mg total) by mouth every morning.   30 capsule   2   . lisinopril-hydrochlorothiazide (PRINZIDE,ZESTORETIC) 10-12.5 MG per tablet            0   . lisinopril-hydrochlorothiazide (PRINZIDE,ZESTORETIC) 20-25 MG per tablet   Oral   Take 1 tablet by mouth daily. for high blood pressure      0   . mirtazapine (REMERON) 15 MG tablet   Oral   Take 1 tablet (15 mg total) by mouth at bedtime.   30 tablet   2   . naproxen (NAPROSYN) 500 MG tablet   Oral   Take 500 mg by mouth 2 (two) times daily with a meal.      0   . pravastatin (PRAVACHOL) 40 MG tablet   Oral   Take 40 mg by mouth every evening. for  cholesterol      0   . simvastatin (ZOCOR) 10 MG tablet            0   . tetracycline (ACHROMYCIN,SUMYCIN) 250 MG capsule      TAKE 1 CAPSULE 3 TIMES DAILY.      0   . traZODone (DESYREL) 50 MG tablet      Take one to two tablets at bedtime as needed for sleep.   60 tablet   1   . ziprasidone (GEODON) 80 MG capsule   Oral   Take 1 capsule (80 mg total) by mouth 2 (two) times daily with a meal.   60 capsule   2     Allergies Aspirin; Ibuprofen; Iodinated diagnostic agents; Latex; and Penicillins  Family History  Problem Relation Age of Onset  . Hypertension Mother   . Alcohol abuse Father   . Alcohol abuse Sister   . Drug abuse Sister   . Alcohol abuse Brother   . Drug abuse Brother   . Alcohol abuse Maternal Uncle   . Alcohol abuse Paternal Uncle   . Bipolar disorder Cousin   . Schizophrenia Daughter   . Depression Daughter   . Alcohol abuse Sister   . Alcohol abuse Sister   . Alcohol abuse Sister   . Alcohol abuse Sister   . Alcohol abuse Sister   . Alcohol abuse Sister   . Alcohol abuse Brother   . Alcohol abuse Brother   . Alcohol abuse Brother   . Alcohol abuse Brother   . Alcohol abuse Brother   . Alcohol abuse Brother   . Alcohol abuse Brother     Social History Social History  Substance Use Topics  . Smoking status: Current Some Day Smoker -- 0.50 packs/day for 30 years    Types: Cigarettes    Start date: 07/24/2005  . Smokeless tobacco: Never Used  . Alcohol Use: No    Review of Systems Constitutional: No fever/chills Eyes: No visual changes. ENT: No sore throat. Cardiovascular: As above  Respiratory: Says has chronic shortness of breath without any change in the pattern. Gastrointestinal: No abdominal pain.  No nausea, no vomiting.  No diarrhea.  No constipation. Genitourinary: Negative for dysuria. Musculoskeletal: Negative for back pain. Skin: Negative for rash. Neurological: Negative for headaches, focal weakness or  numbness.  10-point ROS otherwise negative.  ____________________________________________   PHYSICAL EXAM:  VITAL SIGNS: ED Triage Vitals  Enc Vitals Group     BP 09/05/15  1309 127/85 mmHg     Pulse Rate 09/05/15 1309 71     Resp 09/05/15 1309 18     Temp 09/05/15 1309 98 F (36.7 C)     Temp Source 09/05/15 1309 Oral     SpO2 09/05/15 1309 97 %     Weight 09/05/15 1309 267 lb (121.11 kg)     Height 09/05/15 1309  (1.778 m)     Head Cir --      Peak Flow --      Pain Score 09/05/15 1310 10     Pain Loc --      Pain Edu? --      Excl. in GC? --     Constitutional: Alert and oriented. Well appearing and in no acute distress. Eyes: Conjunctivae are normal. PERRL. EOMI. Head: Atraumatic. TMs normal bilaterally. Nose: No congestion/rhinnorhea. Mouth/Throat: Mucous membranes are moist.  Oropharynx non-erythematous. Neck: No stridor.   Cardiovascular: Normal rate, regular rhythm. Grossly normal heart sounds.  Good peripheral circulation. Respiratory: Normal respiratory effort.  No retractions. Lungs CTAB. Gastrointestinal: Soft and nontender. No distention. No abdominal bruits. No CVA tenderness. Musculoskeletal: No lower extremity tenderness nor edema.  No joint effusions. Neurologic:  Normal speech and language. No gross focal neurologic deficits are appreciated. No gait instability. No nystagmus. No ataxia on finger to nose testing. Skin:  Skin is warm, dry and intact. No rash noted. Psychiatric: Mood and affect are normal. Speech and behavior are normal.  ____________________________________________   LABS (all labs ordered are listed, but only abnormal results are displayed)  Labs Reviewed  BASIC METABOLIC PANEL - Abnormal; Notable for the following:    Potassium 3.0 (*)    Glucose, Bld 127 (*)    Calcium 8.8 (*)    All other components within normal limits  CBC - Abnormal; Notable for the following:    WBC 11.5 (*)    All other components within normal  limits  URINALYSIS COMPLETEWITH MICROSCOPIC ([facility] ONLY) - Abnormal; Notable for the following:    Color, Urine YELLOW (*)    APPearance HAZY (*)    Hgb urine dipstick 1+ (*)    Leukocytes, UA 3+ (*)    Bacteria, UA RARE (*)    Squamous Epithelial / LPF 6-30 (*)    All other components within normal limits  TROPONIN I  MAGNESIUM   ____________________________________________  EKG  ED ECG REPORT I, Arelia Longest, the attending physician, personally viewed and interpreted this ECG.   Date: 09/05/2015  EKG Time: 1321  Rate: 76  Rhythm: normal sinus rhythm  Axis: Normal axis  Intervals:Prolonged QTC at 488  ST&T Change: No ST elevations or depressions. No abnormal T-wave inversions.  ED ECG REPORT I, Arelia Longest, the attending physician, personally viewed and interpreted this ECG.   Date: 09/05/2015  EKG Time: 1514  Rate: 68  Rhythm: normal EKG, normal sinus rhythm  Axis: Normal axis  Intervals:none  ST&T Change: No ST elevations or depressions. No abnormal T-wave inversions.   ____________________________________________  RADIOLOGY  Mild basilar subsegmental atelectasis or scarring on the chest x-ray. I personally reviewed this image. No acute disease on the CT of the head. ____________________________________________   PROCEDURES  ____________________________________________   INITIAL IMPRESSION / ASSESSMENT AND PLAN / ED COURSE  Pertinent labs & imaging results that were available during my care of the patient were reviewed by me and considered in my medical decision making (see chart for details).  ----------------------------------------- 3:22 PM  on 09/05/2015 -----------------------------------------  Patient found to be slightly hypokalemic on her lab results. We'll replete potassium. Discussed the case with Dr. Welton Flakes of cardiology. Specifically discussed the prolonged QTC. Dr. Welton Flakes recommends repletion of the potassium and follow-up in his  office at 9:30 AM tomorrow. The patient continues to be asymptomatic in the emergency department and is able to comply with this follow-up plan. Her repeat EKG is normal with normal intervals. She does not have any previous EKGs with prolonged QTC. ____________________________________________   FINAL CLINICAL IMPRESSION(S) / ED DIAGNOSES  Possible syncope. Chronic chest pain. Return visit.    Myrna Blazer, MD 09/05/15 [phone]

## 2015-09-05 NOTE — ED Notes (Signed)
Pt states all she remembers is going to bed, and then woke up with her foot hanging out of the bed and states she believes she hit her head, pt has knot on forehead, pt states hx of dizziness and syncope, pt awake and alert in no distress

## 2015-10-29 ENCOUNTER — Ambulatory Visit (INDEPENDENT_AMBULATORY_CARE_PROVIDER_SITE_OTHER): Payer: Medicaid Other | Admitting: Psychiatry

## 2015-10-29 ENCOUNTER — Encounter: Payer: Self-pay | Admitting: Psychiatry

## 2015-10-29 VITALS — BP 162/94 | HR 88 | Temp 99.2°F | Ht 70.0 in | Wt 270.4 lb

## 2015-10-29 DIAGNOSIS — F250 Schizoaffective disorder, bipolar type: Secondary | ICD-10-CM

## 2015-10-29 MED ORDER — TRAZODONE HCL 100 MG PO TABS: ORAL_TABLET | ORAL | Status: DC

## 2015-10-29 MED ORDER — MIRTAZAPINE 15 MG PO TABS: 15.0000 mg | ORAL_TABLET | Freq: Every day | ORAL | Status: DC

## 2015-10-29 MED ORDER — ZIPRASIDONE HCL 80 MG PO CAPS: 80.0000 mg | ORAL_CAPSULE | Freq: Two times a day (BID) | ORAL | Status: DC

## 2015-10-29 MED ORDER — FLUOXETINE HCL 20 MG PO CAPS: 20.0000 mg | ORAL_CAPSULE | Freq: Every morning | ORAL | Status: DC

## 2015-10-29 MED ORDER — CLONAZEPAM 1 MG PO TABS: 1.0000 mg | ORAL_TABLET | Freq: Two times a day (BID) | ORAL | Status: DC | PRN

## 2015-10-29 NOTE — Progress Notes (Addendum)
BH MD/PA/NP OP Progress Note  10/29/2015 12:18 PM Sonia Collins  MRN:  [id number]  Subjective:  Patient returns for follow-up of her PTSD and Schizoaffective Disorder. Patient indicates that she has distanced herself from her daughters that had been causing her stress. She states she continues to Main contact with the daughter that is supportive. She feels like overall this is helped her mood. She states she's able to enjoy things with the one daughter who she interacts with. She states that they'll do things like go to restaurants and get coffee.  Asian indicates that her auditory hallucinations from [place] are not present. She states she occasionally sees people but states this might happen 2 or 3 times a month and may fade away and are not disturbing. She does take her medications. She relates that she is finding that at night it does take her a little longer to fall asleep. She related that the trazodone did help initially but perhaps is not working as well now. Chief Complaint: good Chief Complaint    Follow-up; Medication Refill; Stress; Other; Panic Attack; Anxiety     Visit Diagnosis:     ICD-9-CM ICD-10-CM   1. Schizoaffective disorder, bipolar type (HCC) 295.70 F25.0 clonazePAM (KLONOPIN) 1 MG tablet     mirtazapine (REMERON) 15 MG tablet     FLUoxetine (PROZAC) 20 MG capsule     ziprasidone (GEODON) 80 MG capsule    Past Medical History:  Past Medical History  Diagnosis Date  . Anxiety   . Depression   . Hypertension   . Arthritis   . Chronic back pain     Past Surgical History  Procedure Laterality Date  . Leg surgery Bilateral    Family History:  Family History  Problem Relation Age of Onset  . Hypertension Mother   . Alcohol abuse Father   . Alcohol abuse Sister   . Drug abuse Sister   . Alcohol abuse Brother   . Drug abuse Brother   . Alcohol abuse Maternal Uncle   . Alcohol abuse Paternal Uncle   . Bipolar disorder Cousin   . Schizophrenia Daughter   .  Depression Daughter   . Alcohol abuse Sister   . Alcohol abuse Sister   . Alcohol abuse Sister   . Alcohol abuse Sister   . Alcohol abuse Sister   . Alcohol abuse Sister   . Alcohol abuse Brother   . Alcohol abuse Brother   . Alcohol abuse Brother   . Alcohol abuse Brother   . Alcohol abuse Brother   . Alcohol abuse Brother   . Alcohol abuse Brother    Social History:  Social History   Social History  . Marital Status: Widowed    Spouse Name: N/A  . Number of Children: N/A  . Years of Education: N/A   Social History Main Topics  . Smoking status: Current Some Day Smoker -- 0.50 packs/day for 30 years    Types: Cigarettes    Start date: 07/24/2005  . Smokeless tobacco: Never Used  . Alcohol Use: No  . Drug Use: Yes     Comment: 3 mth used marjuana  . Sexual Activity: No   Other Topics Concern  . None   Social History Narrative   Additional History:  Assessment:   Musculoskeletal: Strength & Muscle Tone: within normal limits Gait & Station: normal Patient leans: N/A  Psychiatric Specialty Exam: Anxiety Symptoms include insomnia. Patient reports no nervous/anxious behavior or suicidal ideas.  Review of Systems  Psychiatric/Behavioral: Negative for depression, suicidal ideas, hallucinations, memory loss and substance abuse. The patient has insomnia. The patient is not nervous/anxious.   All other systems reviewed and are negative.   Blood pressure 162/94, pulse 88, temperature 99.2 F (37.3 C), temperature source Tympanic, height  (1.778 m), weight 270 lb 6.4 oz (122.653 kg).Body mass index is 38.8 kg/(m^2).  General Appearance: Fairly Groomed  Eye Contact:  Good  Speech:  Normal Rate  Volume:  Normal  Mood:  Good  Affect:  happy, smiling   Thought Process:  Linear and Logical  Orientation:  Full (Time, Place, and Person)  Thought Content:  Negative  Suicidal Thoughts:  No  Homicidal Thoughts:  No  Memory:  Immediate;   Good Recent;    Good Remote;   Good  Judgement:  Fair  Insight:  Fair  Psychomotor Activity:  Negative  Concentration:  Good  Recall:  Good  Fund of Knowledge: Fair  Language: Fair  Akathisia:  Yes  Handed:  Right unknown   AIMS (if indicated): done 10/29/15 normal  Assets:  Desire for Improvement  ADL's:  Intact  Cognition: WNL  Sleep:  GOOD   Is the patient at risk to self?  No. Has the patient been a risk to self in the past 6 months?  No. Has the patient been a risk to self within the distant past?  No. Is the patient a risk to others?  No. Has the patient been a risk to others in the past 6 months?  No. Has the patient been a risk to others within the distant past?  No.  Current Medications: Current Outpatient Prescriptions  Medication Sig Dispense Refill  . ADVAIR DISKUS 250-50 MCG/DOSE AEPB inhale 1 dose by mouth twice a day FOR ASTHMA/COPD CONTROL  0  . clonazePAM (KLONOPIN) 1 MG tablet Take 1 tablet (1 mg total) by mouth 2 (two) times daily as needed for anxiety. 60 tablet 2  . docusate sodium (COLACE) 100 MG capsule Take by mouth.    Marland Kitchen FLUoxetine (PROZAC) 20 MG capsule Take 1 capsule (20 mg total) by mouth every morning. 30 capsule 2  . lisinopril-hydrochlorothiazide (PRINZIDE,ZESTORETIC) 20-25 MG per tablet Take 1 tablet by mouth daily. for high blood pressure  0  . metoprolol (LOPRESSOR) 100 MG tablet take 1 tablet by mouth twice a day STARTING THE DAY PRIOR TO SCAN...  (REFER TO PRESCRIPTION NOTES).  0  . mirtazapine (REMERON) 15 MG tablet Take 1 tablet (15 mg total) by mouth at bedtime. 30 tablet 2  . naproxen (NAPROSYN) 500 MG tablet Take 500 mg by mouth 2 (two) times daily with a meal.  0  . potassium chloride (K-DUR) 10 MEQ tablet Take 2 tablets (20 mEq total) by mouth at bedtime. 2 tablet 0  . pravastatin (PRAVACHOL) 40 MG tablet Take 40 mg by mouth every evening. for cholesterol  0  . simvastatin (ZOCOR) 10 MG tablet   0  . tetracycline (ACHROMYCIN,SUMYCIN) 250 MG capsule  TAKE 1 CAPSULE 3 TIMES DAILY.  0  . traZODone (DESYREL) 100 MG tablet Take one to one and a half at bedtime for sleep. 45 tablet 2  . ziprasidone (GEODON) 80 MG capsule Take 1 capsule (80 mg total) by mouth 2 (two) times daily with a meal. 60 capsule 2   No current facility-administered medications for this visit.    Medical Decision Making:  Established Problem, Stable/Improving (1) and Review of Medication Regimen & Side Effects (  2)  Treatment Plan Summary:Medication management and Plan   Schizoaffective disorder- Patient is doing well and her mood is stable at this time. She will continue the Geodon 80 mg twice daily, Remeron 15 mg at bedtime, Prozac 20 mg daily and clonazepam 1 mg twice daily. At the last visit we added trazodone and patient states that it is taking her little bit longer to go to sleep. Thus we'll increase her trazodone from 50-100 mg at bedtime to 100-150 mg at bedtime.  PTSD-continue Remeron and Prozac and lorazepam Schizoaffective Disorder  disorder-continue Geodon 80 mg twice daily.  Patient will follow up in 2 months. She is aware she can call with any questions or concerns prior to her next appointment.   Wallace Goinglton Himani Corona 10/29/2015, 12:18 PM

## 2015-11-26 ENCOUNTER — Encounter: Payer: Self-pay | Admitting: Anesthesiology

## 2015-11-26 ENCOUNTER — Ambulatory Visit: Payer: Medicaid Other | Attending: Anesthesiology | Admitting: Anesthesiology

## 2015-11-26 ENCOUNTER — Other Ambulatory Visit: Payer: Self-pay | Admitting: Anesthesiology

## 2015-11-26 VITALS — BP 144/86 | HR 87 | Temp 98.5°F | Resp 16 | Ht 70.0 in | Wt 268.0 lb

## 2015-11-26 DIAGNOSIS — M545 Low back pain, unspecified: Secondary | ICD-10-CM

## 2015-11-26 DIAGNOSIS — G8929 Other chronic pain: Secondary | ICD-10-CM | POA: Insufficient documentation

## 2015-11-26 DIAGNOSIS — G5603 Carpal tunnel syndrome, bilateral upper limbs: Secondary | ICD-10-CM | POA: Insufficient documentation

## 2015-11-26 DIAGNOSIS — M159 Polyosteoarthritis, unspecified: Secondary | ICD-10-CM | POA: Diagnosis not present

## 2015-11-26 DIAGNOSIS — M1611 Unilateral primary osteoarthritis, right hip: Secondary | ICD-10-CM

## 2015-11-26 DIAGNOSIS — M5136 Other intervertebral disc degeneration, lumbar region: Secondary | ICD-10-CM | POA: Diagnosis not present

## 2015-11-26 MED ORDER — TRAMADOL HCL 50 MG PO TABS: 50.0000 mg | ORAL_TABLET | Freq: Four times a day (QID) | ORAL | Status: DC | PRN

## 2015-11-26 NOTE — Patient Instructions (Signed)
Epidural Steroid Injection Patient Information  Description: The epidural space surrounds the nerves as they exit the spinal cord.  In some patients, the nerves can be compressed and inflamed by a bulging disc or a tight spinal canal (spinal stenosis).  By injecting steroids into the epidural space, we can bring irritated nerves into direct contact with a potentially helpful medication.  These steroids act directly on the irritated nerves and can reduce swelling and inflammation which often leads to decreased pain.  Epidural steroids may be injected anywhere along the spine and from the neck to the low back depending upon the location of your pain.   After numbing the skin with local anesthetic (like Novocaine), a small needle is passed into the epidural space slowly.  You may experience a sensation of pressure while this is being done.  The entire block usually last less than 10 minutes.  Conditions which may be treated by epidural steroids:   Low back and leg pain  Neck and arm pain  Spinal stenosis  Post-laminectomy syndrome  Herpes zoster (shingles) pain  Pain from compression fractures  Preparation for the injection:  1. Do not eat any solid food or dairy products within 6 hours of your appointment.  2. You may drink clear liquids up to 2 hours before appointment.  Clear liquids include water, black coffee, juice or soda.  No milk or cream please. 3. You may take your regular medication, including pain medications, with a sip of water before your appointment  Diabetics should hold regular insulin (if taken separately) and take 1/2 normal NPH dos the morning of the procedure.  Carry some sugar containing items with you to your appointment. 4. A driver must accompany you and be prepared to drive you home after your procedure.  5. Bring all your current medications with your. 6. An IV may be inserted and sedation may be given at the discretion of the physician.   7. A blood pressure  cuff, EKG and other monitors will often be applied during the procedure.  Some patients may need to have extra oxygen administered for a short period. 8. You will be asked to provide medical information, including your allergies, prior to the procedure.  We must know immediately if you are taking blood thinners (like Coumadin/Warfarin)  Or if you are allergic to IV iodine contrast (dye). We must know if you could possible be pregnant.  Possible side-effects:  Bleeding from needle site  Infection (rare, may require surgery)  Nerve injury (rare)  Numbness & tingling (temporary)  Difficulty urinating (rare, temporary)  Spinal headache ( a headache worse with upright posture)  Light -headedness (temporary)  Pain at injection site (several days)  Decreased blood pressure (temporary)  Weakness in arm/leg (temporary)  Pressure sensation in back/neck (temporary)  Call if you experience:  Fever/chills associated with headache or increased back/neck pain.  Headache worsened by an upright position.  New onset weakness or numbness of an extremity below the injection site  Hives or difficulty breathing (go to the emergency room)  Inflammation or drainage at the infection site  Severe back/neck pain  Any new symptoms which are concerning to you  Please note:  Although the local anesthetic injected can often make your back or neck feel good for several hours after the injection, the pain will likely return.  It takes 3-7 days for steroids to work in the epidural space.  You may not notice any pain relief for at least that one week.    If effective, we will often do a series of three injections spaced 3-6 weeks apart to maximally decrease your pain.  After the initial series, we generally will wait several months before considering a repeat injection of the same type.  If you have any questions, please call [phone] [facility] 

## 2015-11-26 NOTE — Progress Notes (Signed)
Safety precautions to be maintained throughout the outpatient stay will include: orient to surroundings, keep bed in low position, maintain call bell within reach at all times, provide assistance with transfer out of bed and ambulation.  

## 2015-11-29 ENCOUNTER — Encounter: Payer: Self-pay | Admitting: *Deleted

## 2015-11-30 ENCOUNTER — Encounter: Admission: RE | Payer: Self-pay | Source: Ambulatory Visit

## 2015-11-30 ENCOUNTER — Ambulatory Visit: Admission: RE | Admit: 2015-11-30 | Payer: Medicaid Other | Source: Ambulatory Visit | Admitting: Gastroenterology

## 2015-11-30 SURGERY — COLONOSCOPY WITH PROPOFOL
Anesthesia: General

## 2015-12-02 DIAGNOSIS — G5603 Carpal tunnel syndrome, bilateral upper limbs: Secondary | ICD-10-CM | POA: Insufficient documentation

## 2015-12-02 DIAGNOSIS — M545 Low back pain, unspecified: Secondary | ICD-10-CM | POA: Insufficient documentation

## 2015-12-02 DIAGNOSIS — M1611 Unilateral primary osteoarthritis, right hip: Secondary | ICD-10-CM | POA: Insufficient documentation

## 2015-12-02 NOTE — Progress Notes (Signed)
Subjective:    Patient ID: Sonia Collins, female    DOB: [birth year], 54 y.o.   MRN: [id number] This patient is a 54 year old lady who comes in with  Chronic pain in her back her leg and her arm She indicates that the back pain is the most severe and that is the one  She would like to be treated first. She describes her pain as very severe and sharp  Subjective pain intensity rating Her subjective pain intensity rating is 100%  Pain medications Patient takes Naprosyn and tramadol for pain  Other medications Other medications include Advair Clonopin Colace  ProzacPrinzide Lopressor metoprolol Remeron Geodon and trazodone and Zocor  Allergies Allergies include aspirin ibuprofen often iodinated diagnostic agents latex and penicillin  Past medical history As medical history is positive for carpal tunnel syndrome osteoarthritis of both legs and hypertension  Past surgical history Surgical history is positive for 3 right leg surgeries   HPI    Review of Systems  Constitutional: Negative.   HENT: Negative.   Eyes: Negative.   Respiratory: Negative.   Gastrointestinal: Negative.   Endocrine: Negative.   Genitourinary: Negative.   Musculoskeletal: Positive for myalgias, back pain, joint swelling, arthralgias and gait problem. Negative for neck pain and neck stiffness.       Patient has tenderness in the L4-L5  Bilaterally Torsion test is positive Range of motion is decreased Straight leg raising test on the right  Leg is 60 Straight leg raising test on the left leg is 50  Skin: Negative.   Allergic/Immunologic: Negative.   Neurological: Negative.   Hematological: Negative.   Psychiatric/Behavioral: Negative.        Objective:   Physical Exam  Constitutional: She is oriented to person, place, and time. She appears well-developed and well-nourished. No distress.  HENT:  Head: Normocephalic and atraumatic.  Right Ear: External ear normal.  Left Ear: External ear  normal.  Nose: Nose normal.  Mouth/Throat: Oropharynx is clear and moist.  Eyes: Conjunctivae and EOM are normal. Pupils are equal, round, and reactive to light. Right eye exhibits no discharge. Left eye exhibits no discharge. No scleral icterus.  Neck: Normal range of motion. Neck supple. No JVD present. No tracheal deviation present. No thyromegaly present.  Cardiovascular: Normal rate, regular rhythm, normal heart sounds and intact distal pulses.  Exam reveals no gallop and no friction rub.   No murmur heard. Blood pressure was 144/86 mmHg Pulse was 87 bpm Equal and regular Heart sounds 1 and 2 were heard in all areas There were no audible murmurs  Pulmonary/Chest: Effort normal and breath sounds normal. No respiratory distress. She has no wheezes. She has no rales. She exhibits no tenderness.  Abdominal: Soft. Bowel sounds are normal. She exhibits no distension and no mass. There is no tenderness. There is no rebound and no guarding.  Genitourinary:  Genitourinary examination was deferred  Musculoskeletal: She exhibits no edema or tenderness.  Range of motion was diminished in the l lower extremity  Lymphadenopathy:    She has no cervical adenopathy.  Neurological: She is alert and oriented to person, place, and time. She has normal reflexes. She displays normal reflexes. No cranial nerve deficit. She exhibits normal muscle tone. Coordination normal.  Skin: Skin is warm and dry. No rash noted. She is not diaphoretic. No erythema. No pallor.  Psychiatric: She has a normal mood and affect. Her behavior is normal. Judgment and thought content normal.  Nursing note and vitals reviewed.  Assessment & Plan:    Assessment 1 chronic low back pain 2 Lumbar degenerative disc disease 3 generalized osteoarthritis 4 bilateral carpal tunnel syndrome  Plan of management 1 caudal epidural steroid injection 2 Tramdol 50 mg 3 times a day and will give 42 tablets 3 to come back for  the procedure in 1 month's time.    New patient      Level 4    Tod Persia M.D.

## 2015-12-03 LAB — TOXASSURE SELECT 13 (MW), URINE: PDF: 0

## 2015-12-03 LAB — SPECIMEN STATUS REPORT

## 2015-12-04 DIAGNOSIS — M791 Myalgia, unspecified site: Secondary | ICD-10-CM | POA: Insufficient documentation

## 2015-12-25 ENCOUNTER — Ambulatory Visit: Payer: Medicaid Other | Admitting: Anesthesiology

## 2015-12-27 ENCOUNTER — Ambulatory Visit (INDEPENDENT_AMBULATORY_CARE_PROVIDER_SITE_OTHER): Payer: Medicaid Other | Admitting: Psychiatry

## 2015-12-27 ENCOUNTER — Encounter: Payer: Self-pay | Admitting: Psychiatry

## 2015-12-27 VITALS — BP 110/76 | HR 93 | Temp 97.7°F | Ht 70.0 in | Wt 261.6 lb

## 2015-12-27 DIAGNOSIS — F250 Schizoaffective disorder, bipolar type: Secondary | ICD-10-CM

## 2015-12-27 MED ORDER — MIRTAZAPINE 15 MG PO TABS: 15.0000 mg | ORAL_TABLET | Freq: Every day | ORAL | Status: DC

## 2015-12-27 MED ORDER — TRAZODONE HCL 100 MG PO TABS: 200.0000 mg | ORAL_TABLET | Freq: Every evening | ORAL | Status: DC | PRN

## 2015-12-27 MED ORDER — FLUOXETINE HCL 20 MG PO CAPS: 20.0000 mg | ORAL_CAPSULE | Freq: Every morning | ORAL | Status: DC

## 2015-12-27 MED ORDER — ZIPRASIDONE HCL 80 MG PO CAPS: 80.0000 mg | ORAL_CAPSULE | Freq: Two times a day (BID) | ORAL | Status: DC

## 2015-12-27 MED ORDER — CLONAZEPAM 1 MG PO TABS: 1.0000 mg | ORAL_TABLET | Freq: Two times a day (BID) | ORAL | Status: DC | PRN

## 2015-12-27 NOTE — Progress Notes (Signed)
BH MD/PA/NP OP Progress Note  12/27/2015 12:06 PM Sonia Collins  MRN:  [id number]  Subjective:  Patient returns for follow-up of her PTSD and Schizoaffective Disorder. Patient states she is in a good place at this time. She states the daughter that in the past was largely stressing her and insulting her has been acting nice and in fact assisted her in coming to this appointment today. She is no longer having any auditory hallucinations of [name].  Her only complaint today is dreams that are centered around events in her life. For example she states in December she had a dream that she was at her brother's funeral and then she states one or 2 weeks later he died.  She states her mood is been stable. She states she is not using any marijuana and stopped that in early December.  Chief Complaint: good Chief Complaint    Follow-up; Medication Refill     Visit Diagnosis:     ICD-9-CM ICD-10-CM   1. Schizoaffective disorder, bipolar type (HCC) 295.70 F25.0 ziprasidone (GEODON) 80 MG capsule     clonazePAM (KLONOPIN) 1 MG tablet     FLUoxetine (PROZAC) 20 MG capsule     mirtazapine (REMERON) 15 MG tablet    Past Medical History:  Past Medical History  Diagnosis Date  . Anxiety   . Depression   . Hypertension   . Arthritis   . Chronic back pain     Past Surgical History  Procedure Laterality Date  . Leg surgery Bilateral   . Right knee surgery Right     done 3 times: 20 years ago,and last one done 13 years ago  . Nose surgery     Family History:  Family History  Problem Relation Age of Onset  . Hypertension Mother   . Alcohol abuse Father   . Alcohol abuse Sister   . Drug abuse Sister   . Alcohol abuse Brother   . Drug abuse Brother   . Alcohol abuse Maternal Uncle   . Alcohol abuse Paternal Uncle   . Bipolar disorder Cousin   . Schizophrenia Daughter   . Depression Daughter   . Alcohol abuse Sister   . Alcohol abuse Sister   . Alcohol abuse Sister   . Alcohol abuse  Sister   . Alcohol abuse Sister   . Alcohol abuse Sister   . Alcohol abuse Brother   . Alcohol abuse Brother   . Alcohol abuse Brother   . Alcohol abuse Brother   . Alcohol abuse Brother   . Alcohol abuse Brother   . Alcohol abuse Brother    Social History:  Social History   Social History  . Marital Status: Widowed    Spouse Name: N/A  . Number of Children: N/A  . Years of Education: N/A   Social History Main Topics  . Smoking status: Current Some Day Smoker -- 0.50 packs/day for 30 years    Types: Cigarettes    Start date: 07/24/2005  . Smokeless tobacco: Never Used  . Alcohol Use: No  . Drug Use: Yes    Special: Marijuana     Comment: 30 days used marjuana  . Sexual Activity: No   Other Topics Concern  . None   Social History Narrative   Additional History:  Assessment:   Musculoskeletal: Strength & Muscle Tone: within normal limits Gait & Station: normal Patient leans: N/A  Psychiatric Specialty Exam: Anxiety Patient reports no insomnia, nervous/anxious behavior or suicidal ideas.  Review of Systems  Psychiatric/Behavioral: Negative for depression, suicidal ideas, hallucinations, memory loss and substance abuse. The patient is not nervous/anxious and does not have insomnia.   All other systems reviewed and are negative.   Blood pressure 110/76, pulse 93, temperature 97.7 F (36.5 C), temperature source Tympanic, height 5\' 10"  (1.778 m), weight 261 lb 9.6 oz (118.661 kg), SpO2 92 %.Body mass index is 37.54 kg/(m^2).  General Appearance: Fairly Groomed  Eye Contact:  Good  Speech:  Normal Rate  Volume:  Normal  Mood:  Good  Affect:  happy, smiling   Thought Process:  Linear and Logical  Orientation:  Full (Time, Place, and Person)  Thought Content:  Negative  Suicidal Thoughts:  No  Homicidal Thoughts:  No  Memory:  Immediate;   Good Recent;   Good Remote;   Good  Judgement:  Fair  Insight:  Fair  Psychomotor Activity:  Negative   Concentration:  Good  Recall:  Good  Fund of Knowledge: Fair  Language: Fair  Akathisia:  Yes  Handed:  Right unknown   AIMS (if indicated): done 10/29/15 normal  Assets:  Desire for Improvement  ADL's:  Intact  Cognition: WNL  Sleep:  GOOD   Is the patient at risk to self?  No. Has the patient been a risk to self in the past 6 months?  No. Has the patient been a risk to self within the distant past?  No. Is the patient a risk to others?  No. Has the patient been a risk to others in the past 6 months?  No. Has the patient been a risk to others within the distant past?  No.  Current Medications: Current Outpatient Prescriptions  Medication Sig Dispense Refill  . ADVAIR DISKUS 250-50 MCG/DOSE AEPB inhale 1 dose by mouth twice a day FOR ASTHMA/COPD CONTROL  0  . clonazePAM (KLONOPIN) 1 MG tablet Take 1 tablet (1 mg total) by mouth 2 (two) times daily as needed for anxiety. 60 tablet 4  . FLUoxetine (PROZAC) 20 MG capsule Take 1 capsule (20 mg total) by mouth every morning. 30 capsule 4  . gabapentin (NEURONTIN) 300 MG capsule One tablet once a day for 7 days, then increase to 1 tab twice a day    . lisinopril-hydrochlorothiazide (PRINZIDE,ZESTORETIC) 20-25 MG per tablet Take 1 tablet by mouth daily. for high blood pressure  0  . mirtazapine (REMERON) 15 MG tablet Take 1 tablet (15 mg total) by mouth at bedtime. 30 tablet 4  . omeprazole (PRILOSEC) 40 MG capsule Take by mouth.    . pravastatin (PRAVACHOL) 40 MG tablet Take 40 mg by mouth every evening. for cholesterol  0  . traMADol (ULTRAM) 50 MG tablet Take 1 tablet (50 mg total) by mouth every 6 (six) hours as needed. Take 1 tablet 3 times a day after meals 42 tablet 0  . traZODone (DESYREL) 100 MG tablet Take 2 tablets (200 mg total) by mouth at bedtime as needed for sleep. Take one to one and a half at bedtime for sleep. 60 tablet 4  . ziprasidone (GEODON) 80 MG capsule Take 1 capsule (80 mg total) by mouth 2 (two) times daily with  a meal. 60 capsule 4   No current facility-administered medications for this visit.    Medical Decision Making:  Established Problem, Stable/Improving (1) and Review of Medication Regimen & Side Effects (2)  Treatment Plan Summary:Medication management and Plan   Schizoaffective disorder- Patient is doing well and her mood is  stable at this time. She will continue the Geodon 80 mg twice daily, Remeron 15 mg at bedtime, Prozac 20 mg daily and clonazepam 1 mg twice daily. At the last visit we added trazodone and patient states that it is taking her little bit longer to go to sleep. Thus we'll increase her trazodone from 50-100 mg at bedtime to 100-150 mg at bedtime.  PTSD-continue Remeron and Prozac and lorazepam Schizoaffective Disorder  disorder-continue Geodon 80 mg twice daily.  Patient will follow up in 1 months. She is aware of my departure from the practice in February. I strongly encourage her to follow-up with a therapist to continue to develop her coping strategies. I have given her a list and pointed out one provider on the list that does take patient's insurance. She is aware she can call with any questions or concerns prior to her next appointment.   Wallace Going 12/27/2015, 12:06 PM

## 2016-01-23 ENCOUNTER — Ambulatory Visit: Payer: Medicaid Other | Admitting: Psychiatry

## 2016-02-24 ENCOUNTER — Other Ambulatory Visit: Payer: Self-pay | Admitting: Anesthesiology

## 2016-02-27 DIAGNOSIS — R200 Anesthesia of skin: Secondary | ICD-10-CM | POA: Insufficient documentation

## 2016-03-02 IMAGING — CT CT CHEST-ABD-PELV W/ CM
2 of 5 series · 15 of 46 positions shown, 17 images · IV contrast (agent unspecified)
Comparison: Chest CTA 11/11/2013.

CLINICAL DATA: Right upper quadrant abdominal pain with nausea and
chest pain. No history of malignancy or prior relevant surgery.
Initial encounter.

EXAM:
CT CHEST, ABDOMEN, AND PELVIS WITH CONTRAST
TECHNIQUE: Multidetector CT imaging of the chest, abdomen and pelvis was
performed following the standard protocol during bolus
administration of intravenous contrast.
CONTRAST:  100 ml [id number].

[Series 2: cap with · axial · 0.74mm/px · z∈[-936,-346]mm · 12 of 134 slices shown, 14 images]
[im 8/134  soft-tissue]
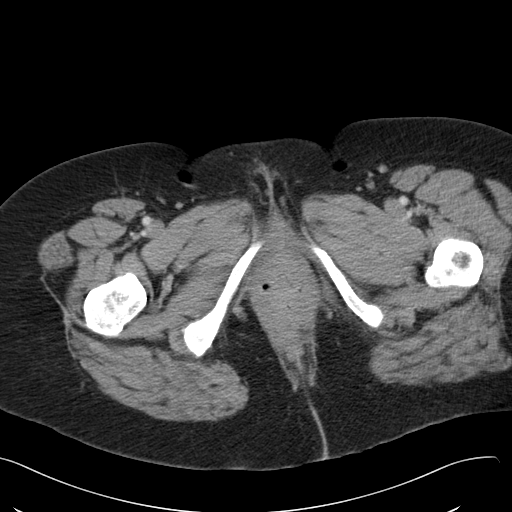
[im 8/134  bone]
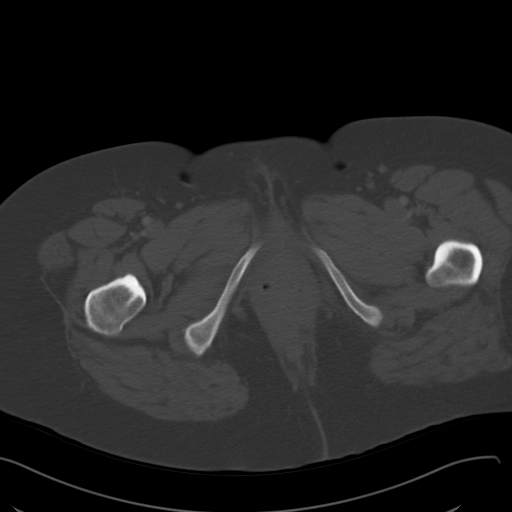
[im 23/134  soft-tissue]
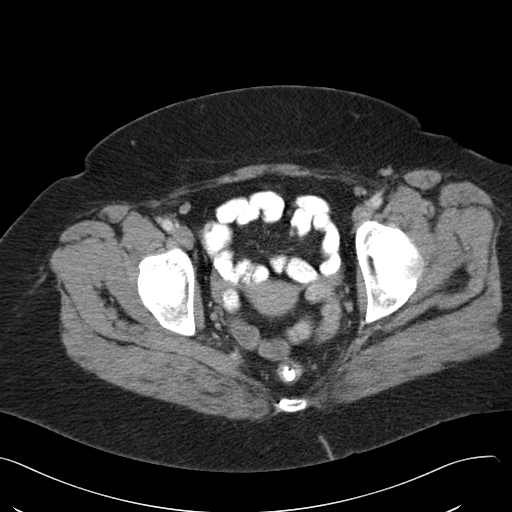
[im 30/134  soft-tissue]
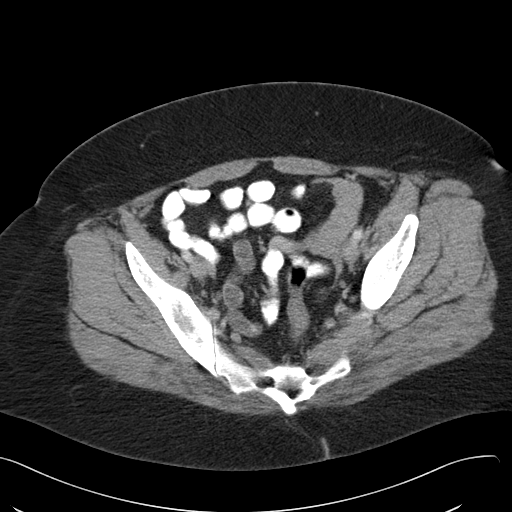
[im 37/134  soft-tissue]
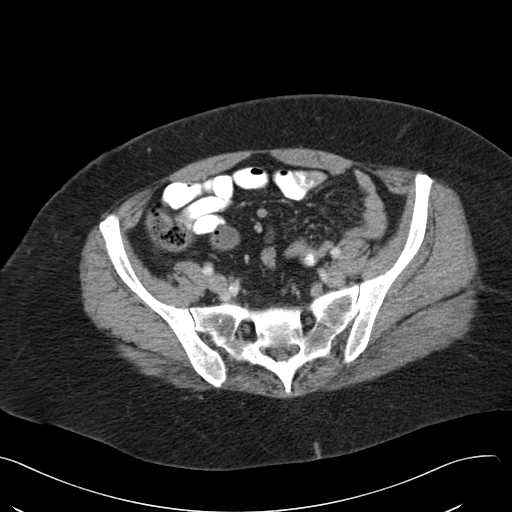
[im 52/134  soft-tissue]
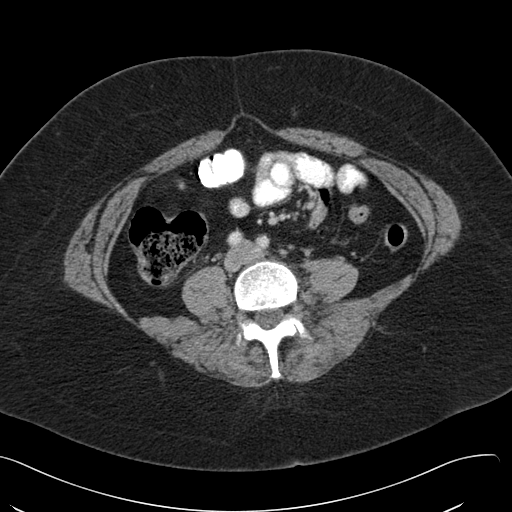
[im 60/134  soft-tissue]
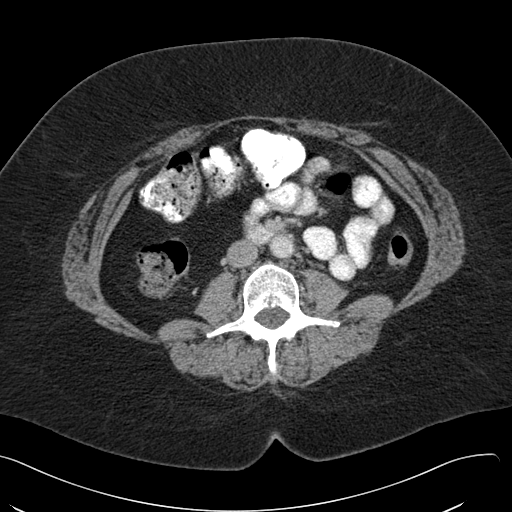
[im 74/134  soft-tissue]
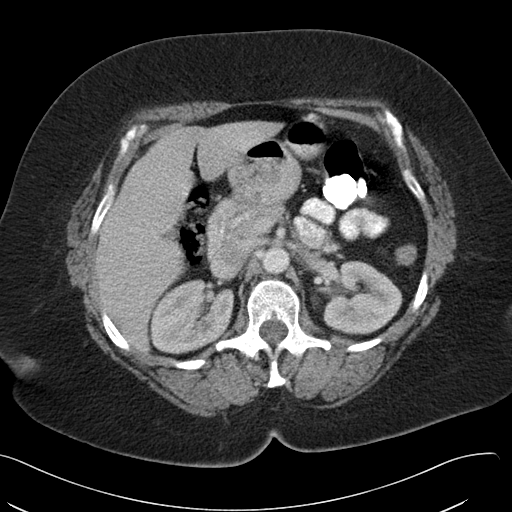
[im 82/134  soft-tissue]
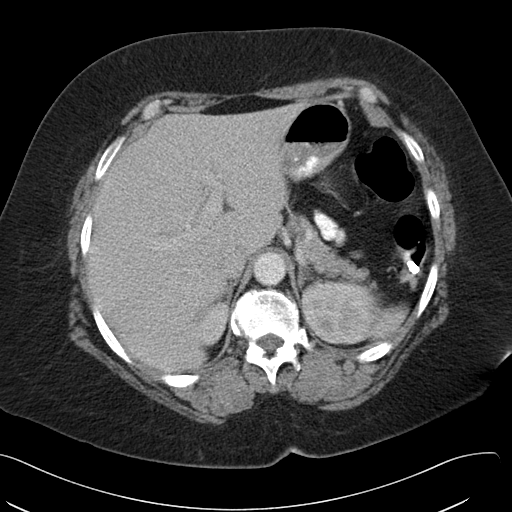
[im 97/134  soft-tissue]
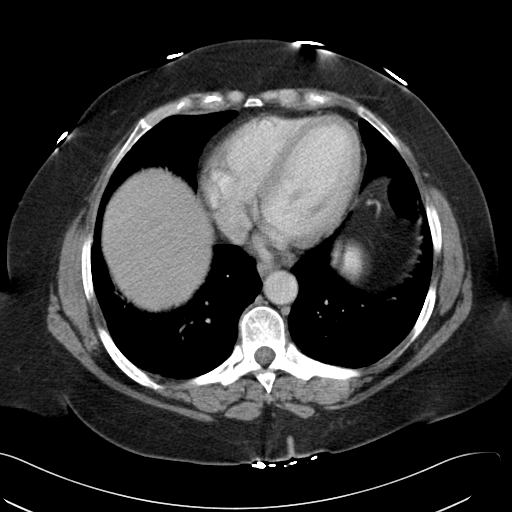
[im 97/134  bone]
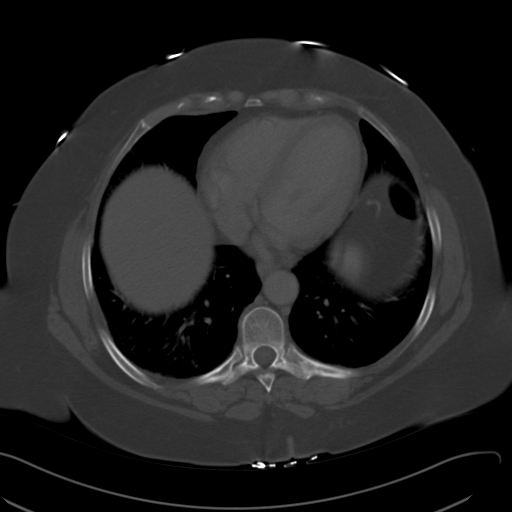
[im 104/134  soft-tissue]
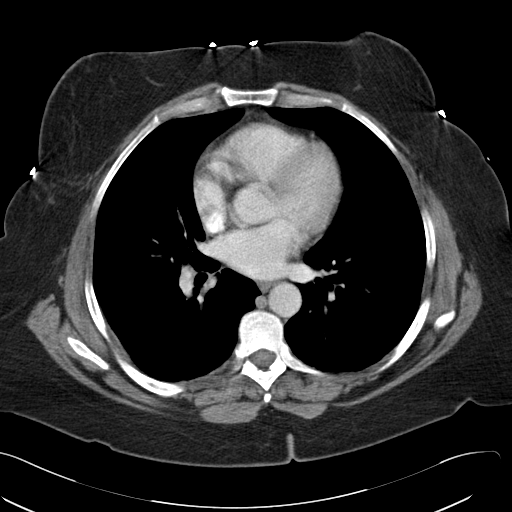
[im 111/134  soft-tissue]
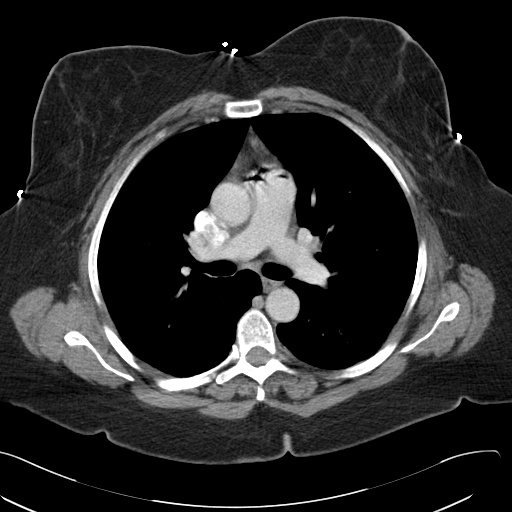
[im 126/134  soft-tissue]
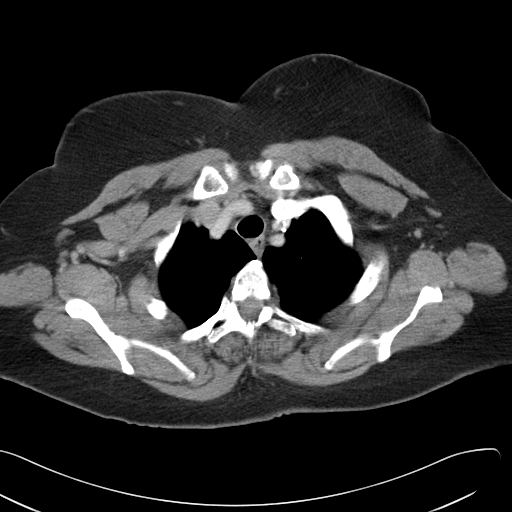

[Series 6: cor cap with · coronal · 0.94mm/px · 3 of 138 slices shown]
[im 46/138  soft-tissue]
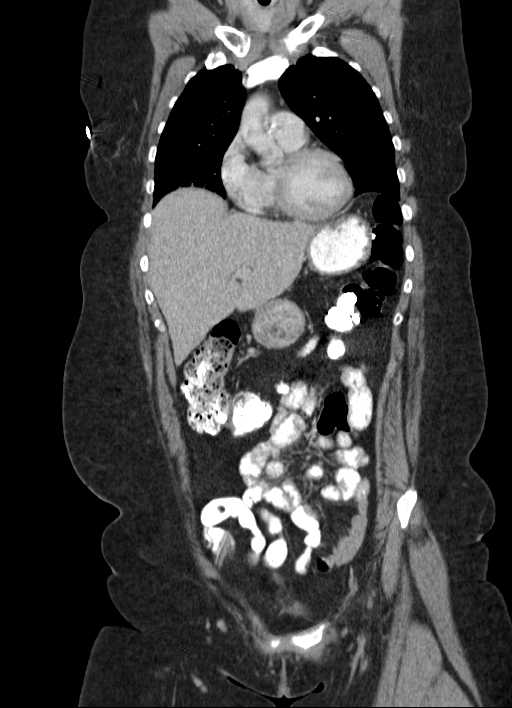
[im 61/138  soft-tissue]
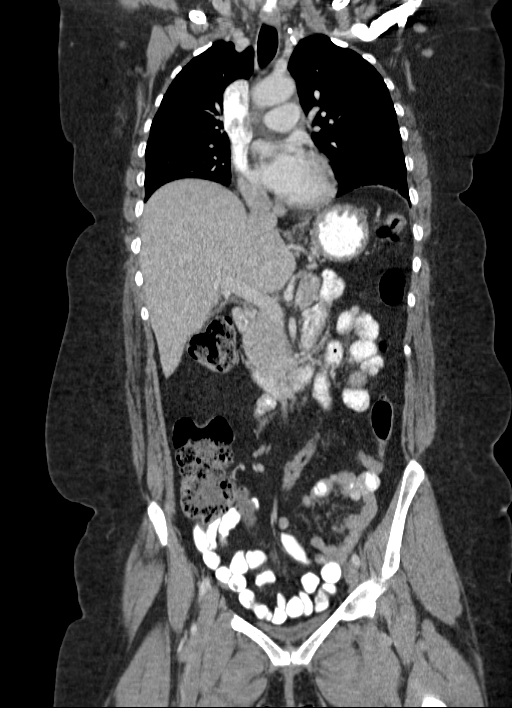
[im 77/138  soft-tissue]
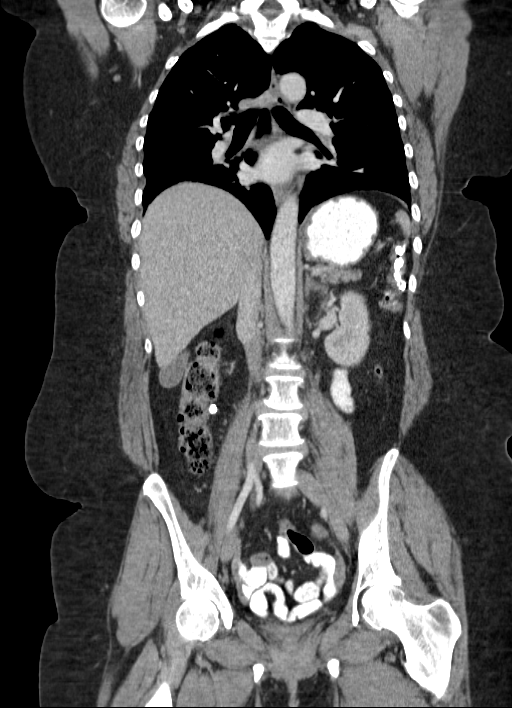

[15 of 46 positions shown; findings below may reference images not displayed]

FINDINGS: CT CHEST FINDINGS

Mediastinum: There are no enlarged mediastinal or hilar lymph nodes.
Asymmetric enlargement of the right thyroid lobe is grossly stable.
The left lobe may have been previously resected. The trachea and
esophagus demonstrate no significant findings. The heart size is
normal. There is no pericardial effusion.There are no significant
vascular findings.

Lungs/Pleura: There is no pleural effusion.There is stable mild
dependent atelectasis at both lung bases.

Musculoskeletal/Chest wall: Small axillary lymph nodes bilaterally
are stable. There is no evidence of chest wall lesion or suspicious
osseous finding.

CT ABDOMEN AND PELVIS FINDINGS

Hepatobiliary: The liver is normal in density without focal
abnormality. No evidence of gallstones, gallbladder wall thickening
or biliary dilatation.

Pancreas: Unremarkable. No pancreatic ductal dilatation or
surrounding inflammatory changes.

Spleen: Normal in size without focal abnormality.

Adrenals/Urinary Tract: Both adrenal glands appear normal.The
kidneys appear normal without urinary tract calculus or
hydronephrosis. The bladder is nearly empty and suboptimally
evaluated.

Stomach/Bowel: No evidence of bowel wall thickening, distention or
surrounding inflammatory change.The appendix appears normal. There
are apparent bilobed calcifications within the lumen of the rectum,
visible on the scout image. This could reflect ingested material or
contrast, although the contrast from the current examination has not
yet passed into the distal colon. There is no surrounding
inflammatory change or proximal bowel distention.

Vascular/Lymphatic: There are no enlarged abdominal pelvic lymph
nodes. There are small external iliac and inguinal lymph nodes
bilaterally. There is mild aortoiliac atherosclerosis.

Reproductive: The uterus and ovaries appear normal. There is no
pelvic inflammatory process.

Other: No evidence of abdominal wall mass or hernia.

Musculoskeletal: No acute or significant osseous findings.
IMPRESSION: 1. No acute findings or explanation for the patient's symptoms
demonstrated. The gallbladder and appendix appear normal.
2. Possible calcifications within the lumen of the rectum. These
could be related to ingested material or contrast. There is no
associated bowel wall thickening or distention.
3. Stable asymmetry of the thyroid gland.

## 2016-04-07 ENCOUNTER — Encounter: Payer: Self-pay | Admitting: Psychiatry

## 2016-04-07 ENCOUNTER — Ambulatory Visit (INDEPENDENT_AMBULATORY_CARE_PROVIDER_SITE_OTHER): Payer: Medicaid Other | Admitting: Psychiatry

## 2016-04-07 ENCOUNTER — Other Ambulatory Visit: Payer: Self-pay

## 2016-04-07 VITALS — BP 138/88 | HR 81 | Temp 98.8°F | Ht 70.0 in | Wt 271.8 lb

## 2016-04-07 DIAGNOSIS — F250 Schizoaffective disorder, bipolar type: Secondary | ICD-10-CM

## 2016-04-07 MED ORDER — GABAPENTIN 300 MG PO CAPS: 300.0000 mg | ORAL_CAPSULE | Freq: Every day | ORAL | Status: DC

## 2016-04-07 MED ORDER — CLONAZEPAM 0.5 MG PO TABS: 1.0000 mg | ORAL_TABLET | Freq: Two times a day (BID) | ORAL | Status: DC | PRN

## 2016-04-07 MED ORDER — MIRTAZAPINE 15 MG PO TABS: 15.0000 mg | ORAL_TABLET | Freq: Every day | ORAL | Status: DC

## 2016-04-07 MED ORDER — ZIPRASIDONE HCL 80 MG PO CAPS: 80.0000 mg | ORAL_CAPSULE | Freq: Two times a day (BID) | ORAL | Status: DC

## 2016-04-07 MED ORDER — TRAZODONE HCL 100 MG PO TABS: 200.0000 mg | ORAL_TABLET | Freq: Every evening | ORAL | Status: DC | PRN

## 2016-04-07 NOTE — Progress Notes (Signed)
BH MD/PA/NP OP Progress Note  04/07/2016 2:19 PM Sonia Collins  MRN:  [id number]  Subjective:  Patient is a 55 year old African-American female who presented for the follow-up appointment. She came with her granddaughter. She was previously following Dr. Mayford KnifeWilliams. She has history of schizoaffective disorder and PTSD. Patient reported that she has been experiencing visual hallucinations and was seeing somebody in her bed 2 days ago. She reported that she was also arrested recently by the police although she was not doing anything. She was put in the jail and her daughter has to release her. She was tearful and anxious during the interview as she reported that her medications are not working. She reported that she takes Klonopin to help her relax. She feels angry and upset quickly and loses temper. She reported that she wants her medications to be adjusted as she has a court date in May and she wants to feel good before her court date. She currently denied having any thoughts to hurt herself or anybody else. She was noted to be losing temper quickly with her granddaughter as well during the interview.    Chief Complaint: good Chief Complaint    Follow-up; Medication Refill     Visit Diagnosis:     ICD-9-CM ICD-10-CM   1. Schizoaffective disorder, bipolar type (HCC) 295.70 F25.0     Past Medical History:  Past Medical History  Diagnosis Date  . Anxiety   . Depression   . Hypertension   . Arthritis   . Chronic back pain     Past Surgical History  Procedure Laterality Date  . Leg surgery Bilateral   . Right knee surgery Right     done 3 times: 20 years ago,and last one done 13 years ago  . Nose surgery     Family History:  Family History  Problem Relation Age of Onset  . Hypertension Mother   . Alcohol abuse Father   . Alcohol abuse Sister   . Drug abuse Sister   . Alcohol abuse Brother   . Drug abuse Brother   . Alcohol abuse Maternal Uncle   . Alcohol abuse Paternal Uncle    . Bipolar disorder Cousin   . Schizophrenia Daughter   . Depression Daughter   . Alcohol abuse Sister   . Alcohol abuse Sister   . Alcohol abuse Sister   . Alcohol abuse Sister   . Alcohol abuse Sister   . Alcohol abuse Sister   . Alcohol abuse Brother   . Alcohol abuse Brother   . Alcohol abuse Brother   . Alcohol abuse Brother   . Alcohol abuse Brother   . Alcohol abuse Brother   . Alcohol abuse Brother    Social History:  Social History   Social History  . Marital Status: Widowed    Spouse Name: N/A  . Number of Children: N/A  . Years of Education: N/A   Social History Main Topics  . Smoking status: Current Some Day Smoker -- 0.50 packs/day for 30 years    Types: Cigarettes    Start date: 07/24/2005  . Smokeless tobacco: Never Used  . Alcohol Use: No  . Drug Use: Yes    Special: Marijuana     Comment: 30 days used marjuana  . Sexual Activity: No   Other Topics Concern  . None   Social History Narrative   Additional History:  Assessment:   Musculoskeletal: Strength & Muscle Tone: within normal limits Gait & Station: normal Patient leans: N/A  Psychiatric Specialty Exam: Anxiety Patient reports no insomnia, nervous/anxious behavior or suicidal ideas.      Review of Systems  Psychiatric/Behavioral: Negative for depression, suicidal ideas, hallucinations, memory loss and substance abuse. The patient is not nervous/anxious and does not have insomnia.   All other systems reviewed and are negative.   Blood pressure 138/88, pulse 81, temperature 98.8 F (37.1 C), temperature source Tympanic, height  (1.778 m), weight 271 lb 12.8 oz (123.288 kg), SpO2 92 %.Body mass index is 39 kg/(m^2).  General Appearance: Fairly Groomed  Eye Contact:  Good  Speech:  Clear and Coherent  Volume:  Normal  Mood:  Anxious and Irritable  Affect:  Congruent, Labile and upset   Thought Process:  Circumstantial  Orientation:  Full (Time, Place, and Person)  Thought  Content:  Hallucinations: Visual  Suicidal Thoughts:  No  Homicidal Thoughts:  No  Memory:  Immediate;   Good Recent;   Good Remote;   Good  Judgement:  Fair  Insight:  Fair  Psychomotor Activity:  Negative  Concentration:  Good  Recall:  Good  Fund of Knowledge: Fair  Language: Fair  Akathisia:  Yes  Handed:  Right unknown   AIMS (if indicated): done 10/29/15 normal  Assets:  Desire for Improvement  ADL's:  Intact  Cognition: WNL  Sleep:  GOOD   Is the patient at risk to self?  No. Has the patient been a risk to self in the past 6 months?  No. Has the patient been a risk to self within the distant past?  No. Is the patient a risk to others?  No. Has the patient been a risk to others in the past 6 months?  No. Has the patient been a risk to others within the distant past?  No.  Current Medications: Current Outpatient Prescriptions  Medication Sig Dispense Refill  . ADVAIR DISKUS 250-50 MCG/DOSE AEPB inhale 1 dose by mouth twice a day FOR ASTHMA/COPD CONTROL  0  . clonazePAM (KLONOPIN) 1 MG tablet Take 1 tablet (1 mg total) by mouth 2 (two) times daily as needed for anxiety. 60 tablet 4  . FLUoxetine (PROZAC) 20 MG capsule Take 1 capsule (20 mg total) by mouth every morning. 30 capsule 4  . gabapentin (NEURONTIN) 300 MG capsule One tablet once a day for 7 days, then increase to 1 tab twice a day    . lisinopril-hydrochlorothiazide (PRINZIDE,ZESTORETIC) 20-25 MG per tablet Take 1 tablet by mouth daily. for high blood pressure  0  . mirtazapine (REMERON) 15 MG tablet Take 1 tablet (15 mg total) by mouth at bedtime. 30 tablet 4  . omeprazole (PRILOSEC) 40 MG capsule Take by mouth.    . pravastatin (PRAVACHOL) 40 MG tablet Take 40 mg by mouth every evening. for cholesterol  0  . traZODone (DESYREL) 100 MG tablet Take 2 tablets (200 mg total) by mouth at bedtime as needed for sleep. Take one to one and a half at bedtime for sleep. 60 tablet 4  . ziprasidone (GEODON) 80 MG capsule  Take 1 capsule (80 mg total) by mouth 2 (two) times daily with a meal. 60 capsule 4  . traMADol (ULTRAM) 50 MG tablet Take 1 tablet (50 mg total) by mouth every 6 (six) hours as needed. Take 1 tablet 3 times a day after meals (Patient not taking: Reported on 04/07/2016) 42 tablet 0   No current facility-administered medications for this visit.    Medical Decision Making:  Established Problem, Stable/Improving (1)  and Review of Medication Regimen & Side Effects (2)  Treatment Plan Summary:Medication management and Plan   Schizoaffective disorder-  Continue Geodon 80 mg by mouth twice a day Continue Remeron 15 mg by mouth daily at bedtime Discontinue Prozac as it is making her agitated Decrease Klonopin 0.5 mg by mouth twice a day Start gabapentin 300 mg by mouth daily at bedtime She will follow-up in 2 weeks or earlier depending on her symptoms   Discussed with patient about her medications and she demonstrated understanding   More than 50% of the time spent in psychoeducation, counseling and coordination of care.    This note was generated in part or whole with voice recognition software. Voice regonition is usually quite accurate but there are transcription errors that can and very often do occur. I apologize for any typographical errors that were not detected and corrected.   Brandy Hale, MD  04/07/2016, 2:19 PM

## 2016-04-21 ENCOUNTER — Ambulatory Visit: Payer: Medicaid Other | Admitting: Psychiatry

## 2016-04-29 ENCOUNTER — Ambulatory Visit: Payer: Medicaid Other | Admitting: Licensed Clinical Social Worker

## 2016-05-07 ENCOUNTER — Ambulatory Visit (INDEPENDENT_AMBULATORY_CARE_PROVIDER_SITE_OTHER): Payer: Medicaid Other | Admitting: Psychiatry

## 2016-05-07 DIAGNOSIS — F250 Schizoaffective disorder, bipolar type: Secondary | ICD-10-CM | POA: Diagnosis not present

## 2016-05-07 MED ORDER — CLONAZEPAM 0.5 MG PO TABS: 0.5000 mg | ORAL_TABLET | Freq: Two times a day (BID) | ORAL | Status: DC | PRN

## 2016-05-07 MED ORDER — MIRTAZAPINE 15 MG PO TABS: 15.0000 mg | ORAL_TABLET | Freq: Every day | ORAL | Status: DC

## 2016-05-07 MED ORDER — CLONAZEPAM 0.5 MG PO TABS
0.5000 mg | ORAL_TABLET | Freq: Two times a day (BID) | ORAL | Status: DC | PRN
Start: 2016-05-07 — End: 2016-11-19

## 2016-05-07 MED ORDER — ZIPRASIDONE HCL 80 MG PO CAPS: 80.0000 mg | ORAL_CAPSULE | Freq: Two times a day (BID) | ORAL | Status: DC

## 2016-05-07 NOTE — Progress Notes (Signed)
BH MD/PA/NP OP Progress Note  05/07/2016 1:18 PM Sonia Collins  MRN:  [id number]  Subjective:  Patient is a 55 year old African-American female who presented for the follow-up appointment. She was previously following Dr. Mayford Knife. She has history of schizoaffective disorder and PTSD. Patient reported that she has been experiencing nausea for the past few days and was recently given Zofran by her primary care physician. She reported that she has taken Klonopin 1 mg in the past and she does not want to continue taking the Klonopin as she feels that the 0.5 mg was making her sick. She was insistent on taking the Klonopin 1 mg and reported that it was more helpful. She was irate and agitated during the interview. She brought the bottle of Zofran in her hand. She stated that she has been doing well and spends most of the time at home. We discussed about her medications in detail. She reported that she is doing well on her current psychotropic medications. She is not experiencing any anxiety at this time. She did not elaborate on her visual hallucinations.  She was showing agitation and was focused on the prescription of Klonopin 1 mg.    Chief Complaint: good  Visit Diagnosis:     ICD-9-CM ICD-10-CM   1. Schizoaffective disorder, bipolar type (HCC) 295.70 F25.0     Past Medical History:  Past Medical History  Diagnosis Date  . Anxiety   . Depression   . Hypertension   . Arthritis   . Chronic back pain     Past Surgical History  Procedure Laterality Date  . Leg surgery Bilateral   . Right knee surgery Right     done 3 times: 20 years ago,and last one done 13 years ago  . Nose surgery     Family History:  Family History  Problem Relation Age of Onset  . Hypertension Mother   . Alcohol abuse Father   . Alcohol abuse Sister   . Drug abuse Sister   . Alcohol abuse Brother   . Drug abuse Brother   . Alcohol abuse Maternal Uncle   . Alcohol abuse Paternal Uncle   . Bipolar disorder  Cousin   . Schizophrenia Daughter   . Depression Daughter   . Alcohol abuse Sister   . Alcohol abuse Sister   . Alcohol abuse Sister   . Alcohol abuse Sister   . Alcohol abuse Sister   . Alcohol abuse Sister   . Alcohol abuse Brother   . Alcohol abuse Brother   . Alcohol abuse Brother   . Alcohol abuse Brother   . Alcohol abuse Brother   . Alcohol abuse Brother   . Alcohol abuse Brother    Social History:  Social History   Social History  . Marital Status: Widowed    Spouse Name: N/A  . Number of Children: N/A  . Years of Education: N/A   Social History Main Topics  . Smoking status: Current Some Day Smoker -- 0.50 packs/day for 30 years    Types: Cigarettes    Start date: 07/24/2005  . Smokeless tobacco: Never Used  . Alcohol Use: No  . Drug Use: Yes    Special: Marijuana     Comment: 30 days used marjuana  . Sexual Activity: No   Other Topics Concern  . Not on file   Social History Narrative   Additional History:  Assessment:   Musculoskeletal: Strength & Muscle Tone: within normal limits Gait & Station: normal Patient leans: N/A  Psychiatric Specialty Exam: Anxiety Patient reports no insomnia, nervous/anxious behavior or suicidal ideas.      Review of Systems  Psychiatric/Behavioral: Negative for depression, suicidal ideas, hallucinations, memory loss and substance abuse. The patient is not nervous/anxious and does not have insomnia.   All other systems reviewed and are negative.   There were no vitals taken for this visit.There is no weight on file to calculate BMI.  General Appearance: Fairly Groomed  Eye Contact:  Good  Speech:  Clear and Coherent  Volume:  Normal  Mood:  Anxious and Irritable  Affect:  Congruent, Labile and upset   Thought Process:  Circumstantial  Orientation:  Full (Time, Place, and Person)  Thought Content:  Hallucinations: Visual  Suicidal Thoughts:  No  Homicidal Thoughts:  No  Memory:  Immediate;   Good Recent;    Good Remote;   Good  Judgement:  Fair  Insight:  Fair  Psychomotor Activity:  Negative  Concentration:  Good  Recall:  Good  Fund of Knowledge: Fair  Language: Fair  Akathisia:  Yes  Handed:  Right unknown   AIMS (if indicated): done 10/29/15 normal  Assets:  Desire for Improvement  ADL's:  Intact  Cognition: WNL  Sleep:  GOOD   Is the patient at risk to self?  No. Has the patient been a risk to self in the past 6 months?  No. Has the patient been a risk to self within the distant past?  No. Is the patient a risk to others?  No. Has the patient been a risk to others in the past 6 months?  No. Has the patient been a risk to others within the distant past?  No.  Current Medications: Current Outpatient Prescriptions  Medication Sig Dispense Refill  . ADVAIR DISKUS 250-50 MCG/DOSE AEPB inhale 1 dose by mouth twice a day FOR ASTHMA/COPD CONTROL  0  . clonazePAM (KLONOPIN) 0.5 MG tablet Take 2 tablets (1 mg total) by mouth 2 (two) times daily as needed for anxiety. 30 tablet 1  . gabapentin (NEURONTIN) 300 MG capsule Take 1 capsule (300 mg total) by mouth at bedtime. 30 capsule 1  . mirtazapine (REMERON) 15 MG tablet Take 1 tablet (15 mg total) by mouth at bedtime. 30 tablet 4  . pravastatin (PRAVACHOL) 40 MG tablet Take 40 mg by mouth every evening. for cholesterol  0  . ziprasidone (GEODON) 80 MG capsule Take 1 capsule (80 mg total) by mouth 2 (two) times daily with a meal. 60 capsule 1   No current facility-administered medications for this visit.    Medical Decision Making:  Established Problem, Stable/Improving (1) and Review of Medication Regimen & Side Effects (2)  Treatment Plan Summary:Medication management and Plan   Schizoaffective disorder-  Continue Geodon 80 mg by mouth twice a day Continue Remeron 15 mg by mouth daily at bedtime Continue Klonopin 0.5 mg by mouth twice a day Start gabapentin 300 mg by mouth daily at bedtime She will follow-up in 4 weeks or  earlier depending on her symptoms  Patient left the prescription of Klonopin at the front desk  before leaving the office   Discussed with patient about her medications and she demonstrated understanding   More than 50% of the time spent in psychoeducation, counseling and coordination of care.    This note was generated in part or whole with voice recognition software. Voice regonition is usually quite accurate but there are transcription errors that can and very often do occur. I apologize  for any typographical errors that were not detected and corrected.   Brandy Hale, MD  05/07/2016, 1:18 PM

## 2016-11-19 ENCOUNTER — Encounter: Payer: Self-pay | Admitting: Psychiatry

## 2016-11-19 ENCOUNTER — Ambulatory Visit (INDEPENDENT_AMBULATORY_CARE_PROVIDER_SITE_OTHER): Payer: Medicaid Other | Admitting: Psychiatry

## 2016-11-19 VITALS — BP 156/98 | HR 88 | Temp 97.8°F | Wt 271.6 lb

## 2016-11-19 DIAGNOSIS — F4312 Post-traumatic stress disorder, chronic: Secondary | ICD-10-CM

## 2016-11-19 DIAGNOSIS — F250 Schizoaffective disorder, bipolar type: Secondary | ICD-10-CM | POA: Diagnosis not present

## 2016-11-19 MED ORDER — QUETIAPINE FUMARATE 50 MG PO TABS: 50.0000 mg | ORAL_TABLET | Freq: Every day | ORAL | 0 refills | Status: DC

## 2016-11-19 MED ORDER — PRAZOSIN HCL 2 MG PO CAPS: 2.0000 mg | ORAL_CAPSULE | Freq: Every day | ORAL | 0 refills | Status: DC

## 2016-11-19 MED ORDER — ESCITALOPRAM OXALATE 10 MG PO TABS: 10.0000 mg | ORAL_TABLET | Freq: Every day | ORAL | 0 refills | Status: DC

## 2016-11-19 NOTE — Progress Notes (Signed)
BH MD/PA/NP OP Progress Note  11/19/2016 12:38 PM Sonia Collins  MRN:  [id number]  Subjective:  Patient is a 55 year old African-American female who presented for the follow-up appointment. She was last seen in May. Patient reported that she went to Dr. Griselda MinerHadden and was seen there for a couple of times. She reported that she does not want to continue following over there. She stated that she has been having problems with her husband as he has been using drugs and alcohol. She reported that he is planning to move to [place] with his sister. Patient reported that she continues to have auditory and visual hallucinations. She reported that she wants to get better. She was apologetic for her behavior at her previous appointment. She does not sleep well at night. She is also having nightmares. She reported that she was taking prazosin in the past which was helpful. She currently denied having any suicidal ideations or plans. She appeared calm and alert during the interview. She denied having any thoughts to hurt herself or anybody else. She is currently living with her daughter has a young baby at home.    Discussed about her medications at length and she agreed with the plan to have them adjusted. She reported that she feels depressed most of the time..    Chief Complaint:  Chief Complaint    Follow-up; Medication Refill     Visit Diagnosis:     ICD-9-CM ICD-10-CM   1. Schizoaffective disorder, bipolar type (HCC) 295.70 F25.0   2. Chronic post-traumatic stress disorder (PTSD) 309.81 F43.12     Past Medical History:  Past Medical History:  Diagnosis Date  . Anxiety   . Arthritis   . Chronic back pain   . Depression   . Hypertension     Past Surgical History:  Procedure Laterality Date  . LEG SURGERY Bilateral   . NOSE SURGERY    . right knee surgery Right    done 3 times: 20 years ago,and last one done 13 years ago   Family History:  Family History  Problem Relation Age of  Onset  . Hypertension Mother   . Alcohol abuse Father   . Alcohol abuse Sister   . Drug abuse Sister   . Alcohol abuse Brother   . Drug abuse Brother   . Alcohol abuse Sister   . Alcohol abuse Sister   . Alcohol abuse Sister   . Alcohol abuse Sister   . Alcohol abuse Sister   . Alcohol abuse Sister   . Alcohol abuse Brother   . Alcohol abuse Brother   . Alcohol abuse Brother   . Alcohol abuse Brother   . Alcohol abuse Brother   . Alcohol abuse Brother   . Alcohol abuse Brother   . Alcohol abuse Maternal Uncle   . Alcohol abuse Paternal Uncle   . Bipolar disorder Cousin   . Schizophrenia Daughter   . Depression Daughter    Social History:  Social History   Social History  . Marital status: Widowed    Spouse name: N/A  . Number of children: N/A  . Years of education: N/A   Social History Main Topics  . Smoking status: Current Some Day Smoker    Packs/day: 0.50    Years: 30.00    Types: Cigarettes    Start date: 07/24/2005  . Smokeless tobacco: Never Used  . Alcohol use No  . Drug use:     Types: Marijuana     Comment: 30  days used marjuana  . Sexual activity: No   Other Topics Concern  . None   Social History Narrative  . None   Additional History:  Assessment:   Musculoskeletal: Strength & Muscle Tone: within normal limits Gait & Station: normal Patient leans: N/A  Psychiatric Specialty Exam: Anxiety  Patient reports no insomnia, nervous/anxious behavior or suicidal ideas.    Medication Refill     Review of Systems  Psychiatric/Behavioral: Negative for depression, hallucinations, memory loss, substance abuse and suicidal ideas. The patient is not nervous/anxious and does not have insomnia.   All other systems reviewed and are negative.   Blood pressure (!) 156/98, pulse 88, temperature 97.8 F (36.6 C), temperature source Oral, weight 271 lb 9.6 oz (123.2 kg).Body mass index is 38.97 kg/m.  General Appearance: Fairly Groomed  Eye Contact:   Good  Speech:  Clear and Coherent  Volume:  Normal  Mood:  Anxious and Irritable  Affect:  Congruent, Labile and upset   Thought Process:  Disorganized  Orientation:  Full (Time, Place, and Person)  Thought Content:  Hallucinations: Visual  Suicidal Thoughts:  No  Homicidal Thoughts:  No  Memory:  Immediate;   Good Recent;   Good Remote;   Good  Judgement:  Fair  Insight:  Fair  Psychomotor Activity:  Negative  Concentration:  Good  Recall:  Good  Fund of Knowledge: Fair  Language: Fair  Akathisia:  Yes  Handed:  Right unknown   AIMS (if indicated): done 10/29/15 normal  Assets:  Desire for Improvement  ADL's:  Intact  Cognition: WNL  Sleep:  GOOD   Is the patient at risk to self?  No. Has the patient been a risk to self in the past 6 months?  No. Has the patient been a risk to self within the distant past?  No. Is the patient a risk to others?  No. Has the patient been a risk to others in the past 6 months?  No. Has the patient been a risk to others within the distant past?  No.  Current Medications: Current Outpatient Prescriptions  Medication Sig Dispense Refill  . ADVAIR DISKUS 250-50 MCG/DOSE AEPB inhale 1 dose by mouth twice a day FOR ASTHMA/COPD CONTROL  0  . pravastatin (PRAVACHOL) 40 MG tablet Take 40 mg by mouth every evening. for cholesterol  0  . escitalopram (LEXAPRO) 10 MG tablet Take 1 tablet (10 mg total) by mouth daily. 30 tablet 0  . prazosin (MINIPRESS) 2 MG capsule Take 1 capsule (2 mg total) by mouth at bedtime. 30 capsule 0  . QUEtiapine (SEROQUEL) 50 MG tablet Take 1 tablet (50 mg total) by mouth at bedtime. 30 tablet 0   No current facility-administered medications for this visit.     Medical Decision Making:  Established Problem, Stable/Improving (1) and Review of Medication Regimen & Side Effects (2)  Treatment Plan Summary:Medication management and Plan   Schizoaffective disorder- I will start her on Lexapro 10 mg daily for her  depressive symptoms. Also start her on Seroquel 50 mg by mouth daily at bedtime. Continue prazosin 2 mg by mouth daily Follow-up in 2 weeks or earlier depending on her symptoms  Advised patient to sign a release of information to get her records from Dr. Robyne AskewHadden's office and she demonstrated understanding Discussed with patient about her medications and she demonstrated understanding   More than 50% of the time spent in psychoeducation, counseling and coordination of care.    This note was generated  in part or whole with voice recognition software. Voice regonition is usually quite accurate but there are transcription errors that can and very often do occur. I apologize for any typographical errors that were not detected and corrected.   Brandy Hale, MD  11/19/2016, 12:38 PM

## 2016-12-03 ENCOUNTER — Ambulatory Visit (INDEPENDENT_AMBULATORY_CARE_PROVIDER_SITE_OTHER): Payer: Medicaid Other | Admitting: Psychiatry

## 2016-12-03 ENCOUNTER — Encounter: Payer: Self-pay | Admitting: Psychiatry

## 2016-12-03 VITALS — BP 148/95 | HR 93 | Temp 98.7°F | Wt 270.2 lb

## 2016-12-03 DIAGNOSIS — F4312 Post-traumatic stress disorder, chronic: Secondary | ICD-10-CM

## 2016-12-03 DIAGNOSIS — F250 Schizoaffective disorder, bipolar type: Secondary | ICD-10-CM

## 2016-12-03 MED ORDER — PALIPERIDONE PALMITATE 234 MG/1.5ML IM SUSP: 234.0000 mg | Freq: Once | INTRAMUSCULAR | 0 refills | Status: DC

## 2016-12-03 MED ORDER — PRAZOSIN HCL 5 MG PO CAPS: 5.0000 mg | ORAL_CAPSULE | Freq: Every day | ORAL | 1 refills | Status: DC

## 2016-12-03 MED ORDER — PALIPERIDONE ER 6 MG PO TB24: 6.0000 mg | ORAL_TABLET | Freq: Every day | ORAL | 0 refills | Status: DC

## 2016-12-03 MED ORDER — BENZTROPINE MESYLATE 0.5 MG PO TABS: 1.0000 mg | ORAL_TABLET | Freq: Two times a day (BID) | ORAL | 1 refills | Status: DC | PRN

## 2016-12-03 MED ORDER — QUETIAPINE FUMARATE 100 MG PO TABS: 100.0000 mg | ORAL_TABLET | Freq: Every day | ORAL | 1 refills | Status: DC

## 2016-12-03 NOTE — Progress Notes (Signed)
BH MD/PA/NP OP Progress Note  12/03/2016 10:39 AM Sonia Collins  MRN:  [id number]  Subjective:  Patient is a 55 year old African-American female who presented for the follow-up appointment. Patient reported that she was having headaches from Lexapro and she stopped taking the medication 2 days ago. She reported that she has noticed that her blood pressure is high. She does not want to continue taking the Lexapro at this time. She continues to have nightmares and hallucinations. She was talking about Annette StableBill who was asking her not to come for this appointment 2 days ago. She reported that the "Annette StableBill  was telling her that I'm going to kill her". She reported that she has not heard those voices in the past 2 days. Patient reported that she "used to hear Annette StableBill "in the past and used to respond to auditory hallucinations but she has not heard auditory hallucinations in a while. She currently denied having any command auditory hallucinations. We discussed about her medications and she is agreeable to start taking the injectables at this time. She reported that she wants to get better. She currently lives with her daughter who is very supportive.   Patient appeared calm and alert during the interview. She currently denied having any auditory or visual hallucinations. She denied having any suicidal homicidal ideations or plans.  Her blood pressure is elevated and advised patient to have her blood pressure repeated before her departure from the office and she agreed with the plan.     Chief Complaint:  Chief Complaint    Follow-up; Medication Problem; Hypertension     Visit Diagnosis:     ICD-9-CM ICD-10-CM   1. Schizoaffective disorder, bipolar type (HCC) 295.70 F25.0   2. Chronic post-traumatic stress disorder (PTSD) 309.81 F43.12     Past Medical History:  Past Medical History:  Diagnosis Date  . Anxiety   . Arthritis   . Chronic back pain   . Depression   . Hypertension     Past Surgical  History:  Procedure Laterality Date  . LEG SURGERY Bilateral   . NOSE SURGERY    . right knee surgery Right    done 3 times: 20 years ago,and last one done 13 years ago   Family History:  Family History  Problem Relation Age of Onset  . Hypertension Mother   . Alcohol abuse Father   . Alcohol abuse Sister   . Drug abuse Sister   . Alcohol abuse Brother   . Drug abuse Brother   . Alcohol abuse Sister   . Alcohol abuse Sister   . Alcohol abuse Sister   . Alcohol abuse Sister   . Alcohol abuse Sister   . Alcohol abuse Sister   . Alcohol abuse Brother   . Alcohol abuse Brother   . Alcohol abuse Brother   . Alcohol abuse Brother   . Alcohol abuse Brother   . Alcohol abuse Brother   . Alcohol abuse Brother   . Alcohol abuse Maternal Uncle   . Alcohol abuse Paternal Uncle   . Bipolar disorder Cousin   . Schizophrenia Daughter   . Depression Daughter    Social History:  Social History   Social History  . Marital status: Widowed    Spouse name: N/A  . Number of children: N/A  . Years of education: N/A   Social History Main Topics  . Smoking status: Current Some Day Smoker    Packs/day: 0.50    Years: 30.00    Types: Cigarettes  Start date: 07/24/2005  . Smokeless tobacco: Never Used  . Alcohol use No  . Drug use:     Types: Marijuana     Comment: 30 days used marjuana  . Sexual activity: No   Other Topics Concern  . None   Social History Narrative  . None   Additional History:  Assessment:   Musculoskeletal: Strength & Muscle Tone: within normal limits Gait & Station: normal Patient leans: N/A  Psychiatric Specialty Exam: Medication Refill   Anxiety  Patient reports no insomnia, nervous/anxious behavior or suicidal ideas.    Hypertension  Associated symptoms include anxiety.    Review of Systems  Psychiatric/Behavioral: Negative for depression, hallucinations, memory loss, substance abuse and suicidal ideas. The patient is not nervous/anxious  and does not have insomnia.   All other systems reviewed and are negative.   Blood pressure (!) 148/95, pulse 93, temperature 98.7 F (37.1 C), temperature source Oral, weight 270 lb 3.2 oz (122.6 kg).Body mass index is 38.77 kg/m.  General Appearance: Fairly Groomed  Eye Contact:  Good  Speech:  Clear and Coherent  Volume:  Normal  Mood:  Anxious and Irritable  Affect:  Congruent, Labile and upset   Thought Process:  Disorganized  Orientation:  Full (Time, Place, and Person)  Thought Content:  Hallucinations: Visual  Suicidal Thoughts:  No  Homicidal Thoughts:  No  Memory:  Immediate;   Good Recent;   Good Remote;   Good  Judgement:  Fair  Insight:  Fair  Psychomotor Activity:  Negative  Concentration:  Good  Recall:  Good  Fund of Knowledge: Fair  Language: Fair  Akathisia:  Yes  Handed:  Right unknown   AIMS (if indicated): done 10/29/15 normal  Assets:  Desire for Improvement  ADL's:  Intact  Cognition: WNL  Sleep:  GOOD   Is the patient at risk to self?  No. Has the patient been a risk to self in the past 6 months?  No. Has the patient been a risk to self within the distant past?  No. Is the patient a risk to others?  No. Has the patient been a risk to others in the past 6 months?  No. Has the patient been a risk to others within the distant past?  No.  Current Medications: Current Outpatient Prescriptions  Medication Sig Dispense Refill  . ADVAIR DISKUS 250-50 MCG/DOSE AEPB inhale 1 dose by mouth twice a day FOR ASTHMA/COPD CONTROL  0  . pravastatin (PRAVACHOL) 40 MG tablet Take 40 mg by mouth every evening. for cholesterol  0  . prazosin (MINIPRESS) 5 MG capsule Take 1 capsule (5 mg total) by mouth at bedtime. 30 capsule 1  . QUEtiapine (SEROQUEL) 100 MG tablet Take 1 tablet (100 mg total) by mouth at bedtime. 30 tablet 1  . benztropine (COGENTIN) 0.5 MG tablet Take 2 tablets (1 mg total) by mouth 2 (two) times daily as needed for tremors. 60 tablet 1  .  paliperidone (INVEGA SUSTENNA) 234 MG/1.5ML SUSP injection Inject 234 mg into the muscle once. 1.5 mL 0  . paliperidone (INVEGA) 6 MG 24 hr tablet Take 1 tablet (6 mg total) by mouth daily. 15 tablet 0   No current facility-administered medications for this visit.     Medical Decision Making:  Established Problem, Stable/Improving (1) and Review of Medication Regimen & Side Effects (2)  Treatment Plan Summary:Medication management and Plan   Schizoaffective disorder- Discontinue Lexapro as she was complaining of headache. Titrate  Seroquel 100  mg by mouth daily at bedtime. I will start her on Invega 6 mg daily Will give her a prescription of Invega Sustenna 234 mg IM and she will be bringing the medication at her next appointment in 10 days Continue prazosin 5 mg by mouth daily Follow-up in 2 weeks or earlier depending on her symptoms  Advised patient to sign a release of information to get her records from Dr. Robyne AskewHadden's office and she demonstrated understanding Discussed with patient about her medications and she demonstrated understanding   More than 50% of the time spent in psychoeducation, counseling and coordination of care.    This note was generated in part or whole with voice recognition software. Voice regonition is usually quite accurate but there are transcription errors that can and very often do occur. I apologize for any typographical errors that were not detected and corrected.   Brandy Hale.   Jayquon Theiler, MD  12/03/2016, 10:39 AM

## 2016-12-18 ENCOUNTER — Ambulatory Visit (INDEPENDENT_AMBULATORY_CARE_PROVIDER_SITE_OTHER): Payer: Medicaid Other | Admitting: Psychiatry

## 2016-12-18 ENCOUNTER — Encounter: Payer: Self-pay | Admitting: Psychiatry

## 2016-12-18 ENCOUNTER — Ambulatory Visit (INDEPENDENT_AMBULATORY_CARE_PROVIDER_SITE_OTHER): Payer: Medicaid Other

## 2016-12-18 ENCOUNTER — Ambulatory Visit: Payer: Self-pay | Admitting: Psychiatry

## 2016-12-18 VITALS — BP 117/77 | HR 88 | Temp 97.5°F | Wt 265.2 lb

## 2016-12-18 DIAGNOSIS — F250 Schizoaffective disorder, bipolar type: Secondary | ICD-10-CM

## 2016-12-18 DIAGNOSIS — F4312 Post-traumatic stress disorder, chronic: Secondary | ICD-10-CM

## 2016-12-18 MED ORDER — PALIPERIDONE PALMITATE 156 MG/ML IM SUSP: 156.0000 mg | Freq: Once | INTRAMUSCULAR | Status: DC

## 2016-12-18 MED ORDER — PALIPERIDONE PALMITATE 234 MG/1.5ML IM SUSP
234.0000 mg | Freq: Once | INTRAMUSCULAR | Status: AC
  Administered 2016-12-18: 234 mg via INTRAMUSCULAR

## 2016-12-18 NOTE — Progress Notes (Signed)
BH MD/PA/NP OP Progress Note  12/18/2016 11:21 AM Sonia Collins  MRN:  [id number]  Subjective:  Patient is a 56 year old African-American female who presented for the follow-up appointment. Patient reported that she is just coming from her eye doctor appointment. Patient reported that her blood pressure was high and they have given her eyedrops and dilated her eye and she is feeling better. She reported that she has stopped the Lexapro and her blood pressure is coming down. She is feeling better today. Patient reported that she has started taking the [facility] and it has been helpful. She brought her injection with her. Patient reported that she is sleeping better with the help of Seroquel. She continues to have auditory hallucinations and visual hallucinations and reported that she is also having visual hallucinations during the daytime. She was talking about deceased people. She reported that she continues to see them when ever she will lay down to take a nap. She is ready to start taking the injectables at this time. Patient continues to denies having any side effects from medications. We discussed about her medications at length.  Patient denied having any suicidal homicidal ideations or plans.We discussed about her medications and she is agreeable to start taking the injectables at this time. She reported that she wants to get better. She currently lives with her daughter who is very supportive.   Patient appeared calm and alert during the interview.      Chief Complaint:  Chief Complaint    Follow-up; Medication Refill     Visit Diagnosis:     ICD-9-CM ICD-10-CM   1. Schizoaffective disorder, bipolar type (HCC) 295.70 F25.0   2. Chronic post-traumatic stress disorder (PTSD) 309.81 F43.12     Past Medical History:  Past Medical History:  Diagnosis Date  . Anxiety   . Arthritis   . Chronic back pain   . Depression   . Hypertension     Past Surgical History:  Procedure Laterality  Date  . LEG SURGERY Bilateral   . NOSE SURGERY    . right knee surgery Right    done 3 times: 20 years ago,and last one done 13 years ago   Family History:  Family History  Problem Relation Age of Onset  . Hypertension Mother   . Alcohol abuse Father   . Alcohol abuse Sister   . Drug abuse Sister   . Alcohol abuse Brother   . Drug abuse Brother   . Alcohol abuse Sister   . Alcohol abuse Sister   . Alcohol abuse Sister   . Alcohol abuse Sister   . Alcohol abuse Sister   . Alcohol abuse Sister   . Alcohol abuse Brother   . Alcohol abuse Brother   . Alcohol abuse Brother   . Alcohol abuse Brother   . Alcohol abuse Brother   . Alcohol abuse Brother   . Alcohol abuse Brother   . Alcohol abuse Maternal Uncle   . Alcohol abuse Paternal Uncle   . Bipolar disorder Cousin   . Schizophrenia Daughter   . Depression Daughter    Social History:  Social History   Social History  . Marital status: Widowed    Spouse name: N/A  . Number of children: N/A  . Years of education: N/A   Social History Main Topics  . Smoking status: Current Some Day Smoker    Packs/day: 0.50    Years: 30.00    Types: Cigarettes    Start date: 07/24/2005  . Smokeless  tobacco: Never Used  . Alcohol use No  . Drug use:     Types: Marijuana     Comment: 30 days used marjuana  . Sexual activity: No   Other Topics Concern  . None   Social History Narrative  . None   Additional History:  Assessment:   Musculoskeletal: Strength & Muscle Tone: within normal limits Gait & Station: normal Patient leans: N/A  Psychiatric Specialty Exam: Hypertension  Associated symptoms include anxiety.  Medication Refill   Anxiety  Patient reports no insomnia, nervous/anxious behavior or suicidal ideas.      Review of Systems  Psychiatric/Behavioral: Negative for depression, hallucinations, memory loss, substance abuse and suicidal ideas. The patient is not nervous/anxious and does not have insomnia.    All other systems reviewed and are negative.   Blood pressure 117/77, pulse 88, temperature 97.5 F (36.4 C), temperature source Oral, weight 265 lb 3.2 oz (120.3 kg).Body mass index is 38.05 kg/m.  General Appearance: Fairly Groomed  Eye Contact:  Good  Speech:  Clear and Coherent  Volume:  Normal  Mood:  Anxious and Irritable  Affect:  Congruent, Labile and upset   Thought Process:  Disorganized  Orientation:  Full (Time, Place, and Person)  Thought Content:  Hallucinations: Visual  Suicidal Thoughts:  No  Homicidal Thoughts:  No  Memory:  Immediate;   Good Recent;   Good Remote;   Good  Judgement:  Fair  Insight:  Fair  Psychomotor Activity:  Negative  Concentration:  Good  Recall:  Good  Fund of Knowledge: Fair  Language: Fair  Akathisia:  Yes  Handed:  Right unknown   AIMS (if indicated): done today- normal   Assets:  Desire for Improvement  ADL's:  Intact  Cognition: WNL  Sleep:  GOOD   Is the patient at risk to self?  No. Has the patient been a risk to self in the past 6 months?  No. Has the patient been a risk to self within the distant past?  No. Is the patient a risk to others?  No. Has the patient been a risk to others in the past 6 months?  No. Has the patient been a risk to others within the distant past?  No.  Current Medications: Current Outpatient Prescriptions  Medication Sig Dispense Refill  . ADVAIR DISKUS 250-50 MCG/DOSE AEPB inhale 1 dose by mouth twice a day FOR ASTHMA/COPD CONTROL  0  . benztropine (COGENTIN) 0.5 MG tablet Take 2 tablets (1 mg total) by mouth 2 (two) times daily as needed for tremors. 60 tablet 1  . pravastatin (PRAVACHOL) 40 MG tablet Take 40 mg by mouth every evening. for cholesterol  0  . prazosin (MINIPRESS) 5 MG capsule Take 1 capsule (5 mg total) by mouth at bedtime. 30 capsule 1  . QUEtiapine (SEROQUEL) 100 MG tablet Take 1 tablet (100 mg total) by mouth at bedtime. 30 tablet 1  . paliperidone (INVEGA SUSTENNA) 234  MG/1.5ML SUSP injection Inject 234 mg into the muscle once. 1.5 mL 0   Current Facility-Administered Medications  Medication Dose Route Frequency Provider Last Rate Last Dose  . paliperidone (INVEGA SUSTENNA) injection 156 mg  156 mg Intramuscular Once Brandy HaleUzma Ranisha Allaire, MD      . paliperidone (INVEGA SUSTENNA) injection 234 mg  234 mg Intramuscular Once Brandy HaleUzma Oceanna Arruda, MD        Medical Decision Making:  Established Problem, Stable/Improving (1) and Review of Medication Regimen & Side Effects (2)  Treatment Plan Summary:Medication management  and Plan   Schizoaffective disorder- Seroquel 100 mg by mouth daily at bedtime. D/c Invega 6 mg daily Will give her a prescription of Invega Sustenna 156  mg IM and she will be bringing the medication at her next appointment next week.  She will get the [place] 234mg  IM today.  Continue prazosin 5 mg by mouth daily Follow-up in 1 weeks or earlier depending on her symptoms  Discussed with patient about her medications and she demonstrated understanding   More than 50% of the time spent in psychoeducation, counseling and coordination of care.    This note was generated in part or whole with voice recognition software. Voice regonition is usually quite accurate but there are transcription errors that can and very often do occur. I apologize for any typographical errors that were not detected and corrected.   Brandy Hale, MD  12/18/2016, 11:21 AM

## 2016-12-18 NOTE — Progress Notes (Signed)
lot # [id number] exp 06-2018  [id number].   NDC#  [phone]. Injection given rt ventrogluteal at 11:24am  pt tolerated well.  Patient brought medication.

## 2016-12-24 ENCOUNTER — Ambulatory Visit: Payer: Medicaid Other | Admitting: Psychiatry

## 2016-12-24 ENCOUNTER — Ambulatory Visit: Payer: Medicaid Other

## 2016-12-26 ENCOUNTER — Ambulatory Visit: Payer: Medicaid Other | Admitting: Psychiatry

## 2016-12-26 ENCOUNTER — Ambulatory Visit: Payer: Medicaid Other

## 2016-12-26 ENCOUNTER — Other Ambulatory Visit: Payer: Self-pay | Admitting: Psychiatry

## 2016-12-31 ENCOUNTER — Ambulatory Visit: Payer: Medicaid Other

## 2016-12-31 ENCOUNTER — Ambulatory Visit: Payer: Medicaid Other | Admitting: Psychiatry

## 2017-01-07 ENCOUNTER — Ambulatory Visit: Payer: Medicaid Other | Admitting: Psychiatry

## 2017-01-07 ENCOUNTER — Ambulatory Visit: Payer: Medicaid Other

## 2017-01-14 ENCOUNTER — Ambulatory Visit: Payer: Medicaid Other

## 2017-01-14 ENCOUNTER — Ambulatory Visit: Payer: Medicaid Other | Admitting: Psychiatry

## 2017-02-16 ENCOUNTER — Ambulatory Visit: Payer: Medicaid Other | Admitting: Psychiatry

## 2017-02-16 ENCOUNTER — Ambulatory Visit: Payer: Medicaid Other

## 2017-02-23 ENCOUNTER — Encounter: Payer: Self-pay | Admitting: Psychiatry

## 2017-02-23 ENCOUNTER — Ambulatory Visit: Payer: Medicaid Other

## 2017-02-23 ENCOUNTER — Ambulatory Visit (INDEPENDENT_AMBULATORY_CARE_PROVIDER_SITE_OTHER): Payer: Medicaid Other | Admitting: Psychiatry

## 2017-02-23 VITALS — BP 127/81 | HR 109 | Temp 98.9°F | Wt 265.0 lb

## 2017-02-23 DIAGNOSIS — F4312 Post-traumatic stress disorder, chronic: Secondary | ICD-10-CM | POA: Diagnosis not present

## 2017-02-23 DIAGNOSIS — F250 Schizoaffective disorder, bipolar type: Secondary | ICD-10-CM

## 2017-02-23 MED ORDER — PALIPERIDONE ER 6 MG PO TB24: 6.0000 mg | ORAL_TABLET | Freq: Every day | ORAL | 1 refills | Status: AC

## 2017-02-23 MED ORDER — QUETIAPINE FUMARATE 100 MG PO TABS: 100.0000 mg | ORAL_TABLET | Freq: Every day | ORAL | 1 refills | Status: AC

## 2017-02-23 MED ORDER — PRAZOSIN HCL 5 MG PO CAPS: 5.0000 mg | ORAL_CAPSULE | Freq: Every day | ORAL | 1 refills | Status: AC

## 2017-02-23 MED ORDER — QUETIAPINE FUMARATE 100 MG PO TABS: 100.0000 mg | ORAL_TABLET | Freq: Every day | ORAL | 1 refills | Status: DC

## 2017-02-23 MED ORDER — BENZTROPINE MESYLATE 0.5 MG PO TABS: 1.0000 mg | ORAL_TABLET | Freq: Two times a day (BID) | ORAL | 1 refills | Status: AC | PRN

## 2017-02-23 MED ORDER — PRAZOSIN HCL 5 MG PO CAPS: 5.0000 mg | ORAL_CAPSULE | Freq: Every day | ORAL | 1 refills | Status: DC

## 2017-02-23 NOTE — Progress Notes (Signed)
BH MD/PA/NP OP Progress Note  02/23/2017 11:02 AM Sonia Collins  MRN:  [id number]  Subjective:  Patient is a 56 year old African-American female who presented for the follow-up appointment. Patient reported that she has been moving back and forth between [place] as she is relocating in the next 2 days with her daughter. She was sad and tearful as her brother just passed away. Patient reported that she missed her appointment due to the same. Patient reported that she has been out of her medications. Patient reported that she has worsening of her anxiety symptoms at this time. She reported that she feels worsening of her nightmares as well. She was married to contract for safety. Patient reported that she is willing to take her medications and will follow-up in [place]. Patient currently denied having any suicidal homicidal ideations or plans.      Chief Complaint:  Chief Complaint    Follow-up; Medication Refill     Visit Diagnosis:   No diagnosis found.  Past Medical History:  Past Medical History:  Diagnosis Date  . Anxiety   . Arthritis   . Chronic back pain   . Depression   . Hypertension     Past Surgical History:  Procedure Laterality Date  . LEG SURGERY Bilateral   . NOSE SURGERY    . right knee surgery Right    done 3 times: 20 years ago,and last one done 13 years ago   Family History:  Family History  Problem Relation Age of Onset  . Hypertension Mother   . Alcohol abuse Father   . Alcohol abuse Sister   . Drug abuse Sister   . Alcohol abuse Brother   . Drug abuse Brother   . Alcohol abuse Sister   . Alcohol abuse Sister   . Alcohol abuse Sister   . Alcohol abuse Sister   . Alcohol abuse Sister   . Alcohol abuse Sister   . Alcohol abuse Brother   . Alcohol abuse Brother   . Alcohol abuse Brother   . Alcohol abuse Brother   . Alcohol abuse Brother   . Alcohol abuse Brother   . Alcohol abuse Brother   . Alcohol abuse Maternal Uncle   .  Alcohol abuse Paternal Uncle   . Bipolar disorder Cousin   . Schizophrenia Daughter   . Depression Daughter    Social History:  Social History   Social History  . Marital status: Widowed    Spouse name: N/A  . Number of children: N/A  . Years of education: N/A   Social History Main Topics  . Smoking status: Current Some Day Smoker    Packs/day: 0.50    Years: 30.00    Types: Cigarettes    Start date: 07/24/2005  . Smokeless tobacco: Never Used  . Alcohol use No  . Drug use: Yes    Types: Marijuana     Comment: 30 days used marjuana  . Sexual activity: No   Other Topics Concern  . None   Social History Narrative  . None   Additional History:  Assessment:   Musculoskeletal: Strength & Muscle Tone: within normal limits Gait & Station: normal Patient leans: N/A  Psychiatric Specialty Exam: Medication Refill   Hypertension  Associated symptoms include anxiety.  Anxiety  Patient reports no insomnia, nervous/anxious behavior or suicidal ideas.      Review of Systems  Psychiatric/Behavioral: Negative for depression, hallucinations, memory loss, substance abuse and suicidal ideas. The patient is not nervous/anxious and  does not have insomnia.   All other systems reviewed and are negative.   There were no vitals taken for this visit.There is no height or weight on file to calculate BMI.  General Appearance: Fairly Groomed  Eye Contact:  Good  Speech:  Clear and Coherent  Volume:  Normal  Mood:  Anxious and Irritable  Affect:  Congruent, Labile and upset   Thought Process:  Disorganized  Orientation:  Full (Time, Place, and Person)  Thought Content:  Hallucinations: Visual  Suicidal Thoughts:  No  Homicidal Thoughts:  No  Memory:  Immediate;   Good Recent;   Good Remote;   Good  Judgement:  Fair  Insight:  Fair  Psychomotor Activity:  Negative  Concentration:  Good  Recall:  Good  Fund of Knowledge: Fair  Language: Fair  Akathisia:  Yes  Handed:   Right unknown   AIMS (if indicated): done today- normal   Assets:  Desire for Improvement  ADL's:  Intact  Cognition: WNL  Sleep:  GOOD   Is the patient at risk to self?  No. Has the patient been a risk to self in the past 6 months?  No. Has the patient been a risk to self within the distant past?  No. Is the patient a risk to others?  No. Has the patient been a risk to others in the past 6 months?  No. Has the patient been a risk to others within the distant past?  No.  Current Medications: Current Outpatient Prescriptions  Medication Sig Dispense Refill  . ADVAIR DISKUS 250-50 MCG/DOSE AEPB inhale 1 dose by mouth twice a day FOR ASTHMA/COPD CONTROL  0  . benztropine (COGENTIN) 0.5 MG tablet Take 2 tablets (1 mg total) by mouth 2 (two) times daily as needed for tremors. 60 tablet 1  . INVEGA SUSTENNA 234 MG/1.5ML SUSP injection inject 234 milligram intramuscularly ONCE 1.5 mL 0  . pravastatin (PRAVACHOL) 40 MG tablet Take 40 mg by mouth every evening. for cholesterol  0  . prazosin (MINIPRESS) 5 MG capsule Take 1 capsule (5 mg total) by mouth at bedtime. 30 capsule 1  . QUEtiapine (SEROQUEL) 100 MG tablet Take 1 tablet (100 mg total) by mouth at bedtime. 30 tablet 1   Current Facility-Administered Medications  Medication Dose Route Frequency Provider Last Rate Last Dose  . paliperidone (INVEGA SUSTENNA) injection 156 mg  156 mg Intramuscular Once Brandy HaleUzma Angelee Bahr, MD        Medical Decision Making:  Established Problem, Stable/Improving (1) and Review of Medication Regimen & Side Effects (2)  Treatment Plan Summary:Medication management and Plan   Schizoaffective disorder- Seroquel 100 mg by mouth daily at bedtime.  Invega 6 mg daily Continue prazosin 5 mg by mouth daily She will [place] in [place] as she is relocating there in the next 2 days.   Discussed with patient about her medications and she demonstrated understanding   More than 50% of the time spent in  psychoeducation, counseling and coordination of care.    This note was generated in part or whole with voice recognition software. Voice regonition is usually quite accurate but there are transcription errors that can and very often do occur. I apologize for any typographical errors that were not detected and corrected.   Brandy Hale.   Sonia Villafranca, MD  02/23/2017, 11:02 AM
